# Patient Record
Sex: Male | Born: 1960 | Race: Black or African American | Hispanic: No | Marital: Married | Smoking: Never smoker
Health system: Southern US, Community
[De-identification: ages and names within clinical notes are randomized; demographics above are authoritative.]

## PROBLEM LIST (undated history)

## (undated) DIAGNOSIS — E785 Hyperlipidemia, unspecified: Secondary | ICD-10-CM

## (undated) DIAGNOSIS — I10 Essential (primary) hypertension: Secondary | ICD-10-CM

## (undated) HISTORY — DX: Essential (primary) hypertension: I10

## (undated) HISTORY — DX: Hyperlipidemia, unspecified: E78.5

---

## 2003-08-04 ENCOUNTER — Emergency Department (HOSPITAL_COMMUNITY): Admission: EM | Admit: 2003-08-04 | Discharge: 2003-08-04 | Payer: Self-pay | Admitting: Emergency Medicine

## 2003-08-14 ENCOUNTER — Emergency Department (HOSPITAL_COMMUNITY): Admission: EM | Admit: 2003-08-14 | Discharge: 2003-08-14 | Payer: Self-pay | Admitting: *Deleted

## 2014-09-14 ENCOUNTER — Ambulatory Visit (INDEPENDENT_AMBULATORY_CARE_PROVIDER_SITE_OTHER): Payer: BC Managed Care – PPO | Admitting: Emergency Medicine

## 2014-09-14 VITALS — BP 136/105 | HR 61 | Temp 99.0°F | Resp 18 | Ht 69.0 in | Wt 198.0 lb

## 2014-09-14 DIAGNOSIS — Z0000 Encounter for general adult medical examination without abnormal findings: Secondary | ICD-10-CM

## 2014-09-14 DIAGNOSIS — Z1211 Encounter for screening for malignant neoplasm of colon: Secondary | ICD-10-CM

## 2014-09-14 DIAGNOSIS — I10 Essential (primary) hypertension: Secondary | ICD-10-CM

## 2014-09-14 LAB — POCT CBC
GRANULOCYTE PERCENT: 72.4 % (ref 37–80)
HCT, POC: 48.4 % (ref 43.5–53.7)
Hemoglobin: 16 g/dL (ref 14.1–18.1)
Lymph, poc: 1.5 (ref 0.6–3.4)
MCH: 29.3 pg (ref 27–31.2)
MCHC: 33.2 g/dL (ref 31.8–35.4)
MCV: 88.4 fL (ref 80–97)
MID (CBC): 0.3 (ref 0–0.9)
MPV: 6.9 fL (ref 0–99.8)
PLATELET COUNT, POC: 272 10*3/uL (ref 142–424)
POC GRANULOCYTE: 4.7 (ref 2–6.9)
POC LYMPH PERCENT: 22.7 %L (ref 10–50)
POC MID %: 4.9 % (ref 0–12)
RBC: 5.47 M/uL (ref 4.69–6.13)
RDW, POC: 13.4 %
WBC: 6.5 10*3/uL (ref 4.6–10.2)

## 2014-09-14 LAB — POCT URINALYSIS DIPSTICK
Bilirubin, UA: NEGATIVE
Glucose, UA: NEGATIVE
Ketones, UA: NEGATIVE
Leukocytes, UA: NEGATIVE
NITRITE UA: NEGATIVE
PH UA: 6
Protein, UA: NEGATIVE
RBC UA: NEGATIVE
Spec Grav, UA: 1.015
Urobilinogen, UA: 0.2

## 2014-09-14 LAB — COMPREHENSIVE METABOLIC PANEL
ALK PHOS: 33 U/L — AB (ref 39–117)
ALT: 26 U/L (ref 0–53)
AST: 23 U/L (ref 0–37)
Albumin: 4.6 g/dL (ref 3.5–5.2)
BUN: 13 mg/dL (ref 6–23)
CO2: 26 mEq/L (ref 19–32)
Calcium: 9.6 mg/dL (ref 8.4–10.5)
Chloride: 103 mEq/L (ref 96–112)
Creat: 1.1 mg/dL (ref 0.50–1.35)
Glucose, Bld: 100 mg/dL — ABNORMAL HIGH (ref 70–99)
POTASSIUM: 4.5 meq/L (ref 3.5–5.3)
Sodium: 139 mEq/L (ref 135–145)
Total Bilirubin: 0.6 mg/dL (ref 0.2–1.2)
Total Protein: 7.3 g/dL (ref 6.0–8.3)

## 2014-09-14 LAB — LIPID PANEL
CHOL/HDL RATIO: 4.9 ratio
Cholesterol: 225 mg/dL — ABNORMAL HIGH (ref 0–200)
HDL: 46 mg/dL (ref >39)
LDL Cholesterol: 139 mg/dL — ABNORMAL HIGH (ref 0–99)
Triglycerides: 199 mg/dL — ABNORMAL HIGH (ref <150)
VLDL: 40 mg/dL (ref 0–40)

## 2014-09-14 LAB — TSH: TSH: 0.82 u[IU]/mL (ref 0.350–4.500)

## 2014-09-14 MED ORDER — LISINOPRIL 20 MG PO TABS: 20.0000 mg | ORAL_TABLET | Freq: Every day | ORAL | Status: DC

## 2014-09-14 NOTE — Patient Instructions (Signed)

## 2014-09-14 NOTE — Progress Notes (Signed)
[facility] [address], [place] [address] [phone]  Date:  09/14/2014   Name:  Troy Anderson   DOB:  12/17/1960   MRN:  [id number]  PCP:  No primary care provider on file.    Chief Complaint: Annual Exam   History of Present Illness:  Troy Anderson is a 53 y.o. very pleasant male patient who presents with the following:  Wellness examination.  Says has had hypertension for a long time Denies other complaint or health concern today.   There are no active problems to display for this patient.   No past medical history on file.  No past surgical history on file.  History  Substance Use Topics  . Smoking status: Never Smoker   . Smokeless tobacco: Not on file  . Alcohol Use: 0.0 oz/week    0 Not specified per week    Family History  Problem Relation Age of Onset  . Cancer Mother   . Diabetes Father     Not on File  Medication list has been reviewed and updated.  No current outpatient prescriptions on file prior to visit.   No current facility-administered medications on file prior to visit.    Review of Systems:  As per HPI, otherwise negative.    Physical Examination: Filed Vitals:   09/14/14 0840  BP: 136/105  Pulse: 61  Temp: 99 F (37.2 C)  Resp: 18   Filed Vitals:   09/14/14 0840  Height: 5\' 9"  (1.753 m)  Weight: 198 lb (89.812 kg)   Body mass index is 29.23 kg/(m^2). Ideal Body Weight: Weight in (lb) to have BMI = 25: 168.9  GEN: WDWN, NAD, Non-toxic, A & O x 3 HEENT: Atraumatic, Normocephalic. Neck supple. No masses, No LAD. Ears and Nose: No external deformity. CV: RRR, No M/G/R. No JVD. No thrill. No extra heart sounds. PULM: CTA B, no wheezes, crackles, rhonchi. No retractions. No resp. distress. No accessory muscle use. ABD: S, NT, ND, +BS. No rebound. No HSM. EXTR: No c/c/e NEURO Normal gait.  PSYCH: Normally interactive. Conversant. Not depressed or anxious appearing.  Calm demeanor.     Assessment and Plan: Wellness exam Colonoscopy Labs Hypertension Lisinopril Follow up 3 months  Signed,  Ellison Carwin, MD

## 2014-09-15 MED ORDER — ATORVASTATIN CALCIUM 20 MG PO TABS: 20.0000 mg | ORAL_TABLET | Freq: Every day | ORAL | Status: DC

## 2014-09-15 NOTE — Addendum Note (Signed)
Addended by: Roselee Culver on: 09/15/2014 08:55 AM   Modules accepted: Orders

## 2014-09-16 ENCOUNTER — Encounter: Payer: Self-pay | Admitting: Gastroenterology

## 2014-09-16 ENCOUNTER — Telehealth: Payer: Self-pay

## 2014-09-16 LAB — PSA: PSA: 0.7 ng/mL (ref <=4.00)

## 2014-09-16 MED ORDER — ATORVASTATIN CALCIUM 20 MG PO TABS: 20.0000 mg | ORAL_TABLET | Freq: Every day | ORAL | Status: DC

## 2014-09-16 NOTE — Telephone Encounter (Signed)
Pt called and stated he already got the lisinopril filled but would like lipitor sent to [facility]. Sent in Rx.

## 2014-09-16 NOTE — Telephone Encounter (Signed)
LMOM for pt to [facility] w/pharmacy info. Rxs printed instead of being sent at [facility] bc no pharm in system.

## 2014-11-11 ENCOUNTER — Ambulatory Visit (AMBULATORY_SURGERY_CENTER): Payer: Self-pay | Admitting: *Deleted

## 2014-11-11 VITALS — Ht 69.5 in | Wt 197.8 lb

## 2014-11-11 DIAGNOSIS — Z1211 Encounter for screening for malignant neoplasm of colon: Secondary | ICD-10-CM

## 2014-11-11 MED ORDER — MOVIPREP 100 G PO SOLR: 1.0000 | Freq: Once | ORAL | Status: DC

## 2014-11-11 NOTE — Progress Notes (Signed)
Denies allergies to eggs or soy products. Denies complications with sedation or anesthesia. Denies O2 use. Denies use of diet or weight loss medications.  Emmi instructions given for colonoscopy.  

## 2014-11-26 ENCOUNTER — Encounter: Payer: Self-pay | Admitting: Gastroenterology

## 2014-11-28 ENCOUNTER — Telehealth: Payer: Self-pay | Admitting: Gastroenterology

## 2014-11-28 NOTE — Telephone Encounter (Signed)
Called patient and gave him the website and the code to watch the colonoscopy video.  He stated that he "didn't get it the first time."  He is on a clear liquid diet today, and seems to be following directions for tomorrow's procedure.

## 2014-11-29 ENCOUNTER — Ambulatory Visit (AMBULATORY_SURGERY_CENTER): Payer: BLUE CROSS/BLUE SHIELD | Admitting: Gastroenterology

## 2014-11-29 ENCOUNTER — Encounter: Payer: Self-pay | Admitting: Gastroenterology

## 2014-11-29 VITALS — BP 134/87 | HR 66 | Temp 96.8°F | Resp 26 | Ht 69.5 in | Wt 197.0 lb

## 2014-11-29 DIAGNOSIS — Z1211 Encounter for screening for malignant neoplasm of colon: Secondary | ICD-10-CM

## 2014-11-29 DIAGNOSIS — D120 Benign neoplasm of cecum: Secondary | ICD-10-CM

## 2014-11-29 DIAGNOSIS — D122 Benign neoplasm of ascending colon: Secondary | ICD-10-CM | POA: Diagnosis not present

## 2014-11-29 DIAGNOSIS — D124 Benign neoplasm of descending colon: Secondary | ICD-10-CM | POA: Diagnosis not present

## 2014-11-29 MED ORDER — SODIUM CHLORIDE 0.9 % IV SOLN: 500.0000 mL | INTRAVENOUS | Status: DC

## 2014-11-29 NOTE — Patient Instructions (Signed)
YOU HAD AN ENDOSCOPIC PROCEDURE TODAY AT THE  ENDOSCOPY CENTER:   Refer to the procedure report that was given to you for any specific questions about what was found during the examination.  If the procedure report does not answer your questions, please call your gastroenterologist to clarify.  If you requested that your care partner not be given the details of your procedure findings, then the procedure report has been included in a sealed envelope for you to review at your convenience later.  YOU SHOULD EXPECT: Some feelings of bloating in the abdomen. Passage of more gas than usual.  Walking can help get rid of the air that was put into your GI tract during the procedure and reduce the bloating. If you had a lower endoscopy (such as a colonoscopy or flexible sigmoidoscopy) you may notice spotting of blood in your stool or on the toilet paper. If you underwent a bowel prep for your procedure, you may not have a normal bowel movement for a few days.  Please Note:  You might notice some irritation and congestion in your nose or some drainage.  This is from the oxygen used during your procedure.  There is no need for concern and it should clear up in a day or so.  SYMPTOMS TO REPORT IMMEDIATELY:   Following lower endoscopy (colonoscopy or flexible sigmoidoscopy):  Excessive amounts of blood in the stool  Significant tenderness or worsening of abdominal pains  Swelling of the abdomen that is new, acute  Fever of 100F or higher    For urgent or emergent issues, a gastroenterologist can be reached at any hour by calling [phone].   DIET: Your first meal following the procedure should be a small meal and then it is ok to progress to your normal diet. Heavy or fried foods are harder to digest and may make you feel nauseous or bloated.  Likewise, meals heavy in dairy and vegetables can increase bloating.  Drink plenty of fluids but you should avoid alcoholic beverages for 24  hours.  ACTIVITY:  You should plan to take it easy for the rest of today and you should NOT DRIVE or use heavy machinery until tomorrow (because of the sedation medicines used during the test).    FOLLOW UP: Our staff will call the number listed on your records the next business day following your procedure to check on you and address any questions or concerns that you may have regarding the information given to you following your procedure. If we do not reach you, we will leave a message.  However, if you are feeling well and you are not experiencing any problems, there is no need to return our call.  We will assume that you have returned to your regular daily activities without incident.  If any biopsies were taken you will be contacted by phone or by letter within the next 1-3 weeks.  Please call us at [phone] if you have not heard about the biopsies in 3 weeks.    SIGNATURES/CONFIDENTIALITY: You and/or your care partner have signed paperwork which will be entered into your electronic medical record.  These signatures attest to the fact that that the information above on your After Visit Summary has been reviewed and is understood.  Full responsibility of the confidentiality of this discharge information lies with you and/or your care-partner.   Information on polyps given to you today 

## 2014-11-29 NOTE — Progress Notes (Signed)
Stable to RR 

## 2014-11-29 NOTE — Op Note (Signed)
[place]  [address] [place], [address]   COLONOSCOPY PROCEDURE REPORT  PATIENT: Troy Anderson, Troy Anderson  MR#: [id number] BIRTHDATE: [birth year] , 53  yrs. old GENDER: male ENDOSCOPIST: Milus Banister, MD PROCEDURE DATE:  11/29/2014 PROCEDURE:   Colonoscopy with snare polypectomy First Screening Colonoscopy - Avg.  risk and is 50 yrs.  old or older Yes.  Prior Negative Screening - Now for repeat screening. N/A  History of Adenoma - Now for follow-up colonoscopy & has been > or = to 3 yrs.  N/A  Polyps Removed Today? Yes. ASA CLASS:   Class II INDICATIONS:patient's immediate family history of colon cancer. MEDICATIONS: Monitored anesthesia care, Propofol 400 mg IV, and Lidocaine 40 mg IV  DESCRIPTION OF PROCEDURE:   After the risks benefits and alternatives of the procedure were thoroughly explained, informed consent was obtained.  The digital rectal exam revealed no abnormalities of the rectum.   The LB [id number]  endoscope was introduced through the anus and advanced to the cecum, which was identified by both the appendix and ileocecal valve. No adverse events experienced.   The quality of the prep was excellent.  The instrument was then slowly withdrawn as the colon was fully examined.   COLON FINDINGS: Three polyps were found, removed and sent to pathology.  These were all sessile, 2-28mm across, located in cecum, ascending, sigmoid segments.  All were removed with cold snare. The examination was otherwise normal.  Retroflexed views revealed no abnormalities. The time to cecum=1 minutes 46 seconds. Withdrawal time=11 minutes 16 seconds.  The scope was withdrawn and the procedure completed. COMPLICATIONS: There were no immediate complications.  ENDOSCOPIC IMPRESSION: Three polyps were found, removed and sent to pathology. The examination was otherwise normal  RECOMMENDATIONS: If the polyp(s) removed today are proven to be adenomatous (pre-cancerous)  polyps, you will need a colonoscopy in 3-5  years. Otherwise you should continue to follow colorectal cancer screening guidelines for "routine risk" patients with a colonoscopy in 10 years.  You will receive a letter within 1-2 weeks with the results of your biopsy as well as final recommendations.  Please call my office if you have not received a letter after 3 weeks.  eSigned:  Milus Banister, MD 11/29/2014 8:38 AM

## 2014-11-29 NOTE — Progress Notes (Signed)
Called to room to assist during endoscopic procedure.  Patient ID and intended procedure confirmed with present staff. Received instructions for my participation in the procedure from the performing physician.  

## 2014-12-02 ENCOUNTER — Telehealth: Payer: Self-pay | Admitting: *Deleted

## 2014-12-02 NOTE — Telephone Encounter (Signed)
  Follow up Call-  Call back number 11/29/2014  Post procedure Call Back phone  # [phone]  Permission to leave phone message Yes     Patient questions:  Left message to call us if necessary.

## 2014-12-09 ENCOUNTER — Encounter: Payer: Self-pay | Admitting: Gastroenterology

## 2014-12-13 ENCOUNTER — Telehealth: Payer: Self-pay

## 2014-12-13 NOTE — Telephone Encounter (Signed)
Refill on cholesterol medication. Vladimir Faster on [address]    [phone]

## 2014-12-14 ENCOUNTER — Other Ambulatory Visit: Payer: Self-pay | Admitting: Emergency Medicine

## 2014-12-16 ENCOUNTER — Other Ambulatory Visit: Payer: Self-pay | Admitting: Emergency Medicine

## 2014-12-16 NOTE — Telephone Encounter (Signed)
Spoke to pt, he is aware of refill and need for ov for further refills

## 2015-08-08 ENCOUNTER — Ambulatory Visit
Admission: EM | Admit: 2015-08-08 | Discharge: 2015-08-08 | Disposition: A | Discharge status: Home / Self Care | Payer: PRIVATE HEALTH INSURANCE | Attending: Family Medicine | Admitting: Family Medicine

## 2015-08-08 ENCOUNTER — Encounter: Payer: Self-pay | Admitting: Emergency Medicine

## 2015-08-08 DIAGNOSIS — Z029 Encounter for administrative examinations, unspecified: Secondary | ICD-10-CM

## 2015-08-08 DIAGNOSIS — Z024 Encounter for examination for driving license: Secondary | ICD-10-CM

## 2015-08-08 LAB — DEPT OF TRANSP DIPSTICK, URINE (ARMC ONLY)
Glucose, UA: NEGATIVE mg/dL
Hgb urine dipstick: NEGATIVE
PROTEIN: NEGATIVE mg/dL
SPECIFIC GRAVITY, URINE: 1.015 (ref 1.005–1.030)

## 2015-08-08 NOTE — ED Provider Notes (Signed)
CSN: :[id number]     Arrival date & time 08/08/15  1305 History   First MD Initiated Contact with Patient 08/08/15 1400     Chief Complaint  Patient presents with  . 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  Last saw PCM 03 Mar 2015 BP 135/68.  Has colonoscopy 11/28/2014 3 polyps removed.  Optometry last month glasses working well for him.  Cholesterol diet controlled statin gave him muscle aches.  Denied hospitalizations, ER visits, LOC, chest pain, headaches, seasonal allergies, back pain, injuries or tatoos.  The history is provided by the patient.    Past Medical History  Diagnosis Date  . Hyperlipidemia   . Hypertension    History reviewed. No pertinent past surgical history. Family History  Problem Relation Age of Onset  . Cancer Mother   . Colon cancer Mother   . Stomach cancer Mother   . Diabetes Father   . Esophageal cancer Neg Hx   . Rectal cancer Neg Hx    Social History  Substance Use Topics  . Smoking status: Never Smoker   . Smokeless tobacco: Never Used  . Alcohol Use: 0.6 oz/week    1 Standard drinks or equivalent per week    Review of Systems  Constitutional: Negative for fever and chills.  HENT: Negative for congestion, ear pain and sore throat.   Eyes: Negative for pain and discharge.  Respiratory: Negative for cough and wheezing.   Cardiovascular: Negative for chest pain and leg swelling.  Gastrointestinal: Negative for nausea, vomiting, diarrhea, constipation and blood in stool.  Genitourinary: Negative for dysuria, hematuria and difficulty urinating.  Musculoskeletal: Negative for myalgias, back pain and arthralgias.  Skin: Negative for rash.  Allergic/Immunologic: Negative for environmental  allergies and food allergies.  Neurological: Negative for headaches.  Hematological: Negative for adenopathy. Does not bruise/bleed easily.  Psychiatric/Behavioral: Negative for confusion and agitation. The patient is not nervous/anxious.     Allergies  Review of patient's allergies indicates no known allergies.  Home Medications   Prior to Admission medications   Medication Sig Start Date End Date Taking? Authorizing Provider  atorvastatin (LIPITOR) 20 MG tablet TAKE ONE TABLET BY MOUTH ONCE DAILY.  "OV NEEDED FOR ADDITIONAL REFILLS" 12/15/14   Mancel Bale, PA-C  atorvastatin (LIPITOR) 20 MG tablet Take one tablet by mouth once daily.  "OV NEEDED FOR ADDITIONAL REFILLS" 12/16/14   Harrison Mons, PA-C  lisinopril (PRINIVIL,ZESTRIL) 20 MG tablet Take 1 tablet (20 mg total) by mouth daily. 09/14/14   Roselee Culver, MD   Meds Ordered and Administered this Visit  Medications - No data to display  BP 145/90 mmHg  Pulse 90  Temp(Src) 98.4 F (36.9 C) (Tympanic)  Resp 16  Ht 5' 9.5" (1.765 m)  Wt 199 lb (90.266 kg)  BMI 28.98 kg/m2  SpO2 100% No data found.   Physical Exam  Constitutional: He is oriented to person, place, and time. Vital signs are normal. He appears well-developed and well-nourished. He is active and cooperative.  Non-toxic appearance. He does not have a sickly appearance. He does not appear ill. No distress.  HENT:  Head: Normocephalic and atraumatic.  Right Ear: Hearing, external ear and ear canal normal. A middle ear effusion is present.  Left Ear: Hearing, external ear and ear  canal normal. A middle ear effusion is present.  Nose: Mucosal edema and rhinorrhea present. No nose lacerations, sinus tenderness, nasal deformity, septal deviation or nasal septal hematoma. No epistaxis.  No foreign bodies. Right sinus exhibits maxillary sinus tenderness. Right sinus exhibits no frontal sinus tenderness. Left sinus exhibits maxillary sinus tenderness. Left sinus  exhibits no frontal sinus tenderness.  Mouth/Throat: Uvula is midline and mucous membranes are normal. Mucous membranes are not pale, not dry and not cyanotic. He does not have dentures. No oral lesions. No trismus in the jaw. Normal dentition. No dental abscesses, uvula swelling, lacerations or dental caries. Posterior oropharyngeal edema and posterior oropharyngeal erythema present. No oropharyngeal exudate or tonsillar abscesses.  Eyes: Conjunctivae, EOM and lids are normal. Pupils are equal, round, and reactive to light. Right eye exhibits no chemosis, no discharge, no exudate and no hordeolum. No foreign body present in the right eye. Left eye exhibits no chemosis, no discharge, no exudate and no hordeolum. No foreign body present in the left eye. Right conjunctiva is not injected. Right conjunctiva has no hemorrhage. Left conjunctiva is not injected. Left conjunctiva has no hemorrhage. No scleral icterus. Right eye exhibits normal extraocular motion and no nystagmus. Left eye exhibits normal extraocular motion and no nystagmus. Right pupil is round and reactive. Left pupil is round and reactive. Pupils are equal.  Neck: Trachea normal and normal range of motion. Neck supple. No tracheal tenderness, no spinous process tenderness and no muscular tenderness present. No rigidity. No tracheal deviation, no edema, no erythema and normal range of motion present. No thyroid mass and no thyromegaly present.  Cardiovascular: Normal rate, regular rhythm, S1 normal, S2 normal, normal heart sounds and intact distal pulses.  PMI is not displaced.  Exam reveals no gallop and no friction rub.   No murmur heard. Pulses:      Radial pulses are 2+ on the right side, and 2+ on the left side.  Pulmonary/Chest: Effort normal and breath sounds normal. No stridor. No respiratory distress. He has no decreased breath sounds. He has no wheezes. He has no rhonchi. He has no rales. He exhibits no tenderness.  Abdominal: Soft.  Bowel sounds are normal. He exhibits no distension and no mass. There is no hepatosplenomegaly. There is no tenderness. There is no rebound and no guarding. Hernia confirmed negative in the ventral area, confirmed negative in the right inguinal area and confirmed negative in the left inguinal area.  Dull to percussion x 4 quads  Musculoskeletal: Normal range of motion. He exhibits no edema or tenderness.       Right shoulder: Normal.       Left shoulder: Normal.       Right elbow: Normal.      Left elbow: Normal.       Right wrist: Normal.       Left wrist: Normal.       Right hip: Normal.       Left hip: Normal.       Right knee: Normal.       Left knee: Normal.       Right ankle: Normal.       Left ankle: Normal.       Cervical back: Normal.       Thoracic back: Normal.       Lumbar back: Normal.       Right hand: Normal.       Left hand: Normal.       Left foot: Normal.  Lymphadenopathy:       Head (right side): No submental, no submandibular, no tonsillar, no preauricular, no posterior auricular and no occipital adenopathy present.       Head (left side): No submental, no submandibular, no tonsillar, no preauricular, no posterior auricular and no occipital adenopathy present.    He has no cervical adenopathy.       Right cervical: No superficial cervical, no deep cervical and no posterior cervical adenopathy present.      Left cervical: No superficial cervical, no deep cervical and no posterior cervical adenopathy present.  Neurological: He is alert and oriented to person, place, and time. He is not disoriented. He displays no atrophy and no tremor. No cranial nerve deficit or sensory deficit. He exhibits normal muscle tone. He displays no seizure activity. Coordination and gait normal. GCS eye subscore is 4. GCS verbal subscore is 5. GCS motor subscore is 6.  Reflex Scores:      Patellar reflexes are 2+ on the right side and 2+ on the left side.      Achilles reflexes are 2+ on the  right side and 2+ on the left side. Skin: Skin is warm, dry and intact. No abrasion, no bruising, no burn, no ecchymosis, no laceration, no lesion, no petechiae and no rash noted. He is not diaphoretic. No cyanosis or erythema. No pallor. Nails show no clubbing.     Psychiatric: He has a normal mood and affect. His speech is normal and behavior is normal. Judgment and thought content normal. Cognition and memory are normal.  Nursing note and vitals reviewed.   ED Course  Procedures (including critical care time)  Labs Review Labs Reviewed  DEPT OF TRANSP DIPSTICK, URINE([facility] ONLY)    Imaging Review No results found.   Visual Acuity Review  Right Eye Distance: 20/20 corrected Left Eye Distance: 20/20 corrected Bilateral Distance: 20/15 corrected  Right Eye Near:   Left Eye Near:    Bilateral Near:      1500 given copy of urinalysis results, going to go eat lunch, hydrate and return for repeat blood pressure. Reviewed care everywhere EPIC and patient blood pressure 130s/70s every recorded visit.  Patient has pictures on phone when he checks himself and all below 140/90.  Typically takes blood pressure medication at bedtime.   Patient verbalized understanding of information/instructions, agreed with plan of care and had no further questions at this time.  1600 patient returned for repeat blood pressure.  Discussed with nurse to have him sit in chair for 10-15 minutes before repeating measurement.  Patient verbalized understanding of information/instructions and had no further questions at this time  1645 patient reported he had to urinate, anxious because his blood pressure readings have not been high at other doctor visits and this is pre-employment physical.  Had patient urinate, sit in chair and manual blood pressure performed 140/90 pulse 90.  Completed paperwork.  Patient is going to follow up with his PCM.  Discussed with patient anxiety, stress, caffeine can all  raise his blood pressure.  Patient verbalized understanding of information/instructions, agreed with plan of care and had no further questions at this time.  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 BMI 29 discussed weight loss to BMI less than 25 or 171lbs and activity 150 minutes per week recommended.  Recommended routine optometry exams every 1-2 years due  to hypertension and age. See copies in record manager for [place], [id number].  Patient instructed to follow up for re-evaluation in 1 year sooner if changes in his health status e.g. diagnosis chronic disease, surgery, hospitalization.  Discussed with patient urinalysis was normal.  Exitcare handout on obesity, dash diet, hypertension and medical screening given to patient.   Patient verbalized understanding of information/instructions, agreed with plan of care and had no further questions at this time.   Olen Cordial, NP 08/09/15 0800

## 2015-08-08 NOTE — ED Notes (Signed)
Patient here for DOT Physical.  

## 2015-08-08 NOTE — Discharge Instructions (Signed)
Medical Screening Exam °A medical screening exam has been done. This exam helps find the cause of your problem and determines whether you need emergency treatment. Your exam has shown that you do not need emergency treatment at this point. It is safe for you to go to your caregiver's office or clinic for treatment. You should make an appointment today to see your caregiver as soon as he or she is available. °Depending on your illness, your symptoms and condition can change over time. If your condition gets worse or you develop new or troubling symptoms before you see your caregiver, you should return to the emergency department for further evaluation.  °  °This information is not intended to replace advice given to you by your health care provider. Make sure you discuss any questions you have with your health care provider. °  °Document Released: 10/21/2004 Document Revised: 10/04/2014 Document Reviewed: 06/02/2011 °Elsevier Interactive Patient Education ©2016 [employer] °Hypertension °Hypertension, commonly called high blood pressure, is when the force of blood pumping through your arteries is too strong. Your arteries are the blood vessels that carry blood from your heart throughout your body. A blood pressure reading consists of a higher number over a lower number, such as 110/72. The higher number (systolic) is the pressure inside your arteries when your heart pumps. The lower number (diastolic) is the pressure inside your arteries when your heart relaxes. Ideally you want your blood pressure below 120/80. °Hypertension forces your heart to work harder to pump blood. Your arteries may become narrow or stiff. Having untreated or uncontrolled hypertension can cause heart attack, stroke, kidney disease, and other problems. °RISK FACTORS °Some risk factors for high blood pressure are controllable. Others are not.  °Risk factors you cannot control include:  °· Race. You may be at higher risk if you are African  American. °· Age. Risk increases with age. °· Gender. Men are at higher risk than women before age 45 years. After age 65, women are at higher risk than men. °Risk factors you can control include: °· Not getting enough exercise or physical activity. °· Being overweight. °· Getting too much fat, sugar, calories, or salt in your diet. °· Drinking too much alcohol. °SIGNS AND SYMPTOMS °Hypertension does not usually cause signs or symptoms. Extremely high blood pressure (hypertensive crisis) may cause headache, anxiety, shortness of breath, and nosebleed. °DIAGNOSIS °To check if you have hypertension, your health care provider will measure your blood pressure while you are seated, with your arm held at the level of your heart. It should be measured at least twice using the same arm. Certain conditions can cause a difference in blood pressure between your right and left arms. A blood pressure reading that is higher than normal on one occasion does not mean that you need treatment. If it is not clear whether you have high blood pressure, you may be asked to return on a different day to have your blood pressure checked again. Or, you may be asked to monitor your blood pressure at home for 1 or more weeks. °TREATMENT °Treating high blood pressure includes making lifestyle changes and possibly taking medicine. Living a healthy lifestyle can help lower high blood pressure. You may need to change some of your habits. °Lifestyle changes may include: °· Following the DASH diet. This diet is high in fruits, vegetables, and whole grains. It is low in salt, red meat, and added sugars. °· Keep your sodium intake below 2,300 mg per day. °· Getting at   least 30-45 minutes of aerobic exercise at least 4 times per week. °· Losing weight if necessary. °· Not smoking. °· Limiting alcoholic beverages. °· Learning ways to reduce stress. °Your health care provider may prescribe medicine if lifestyle changes are not enough to get your blood  pressure under control, and if one of the following is true: °· You are 18-59 years of age and your systolic blood pressure is above 140. °· You are 60 years of age or older, and your systolic blood pressure is above 150. °· Your diastolic blood pressure is above 90. °· You have diabetes, and your systolic blood pressure is over 140 or your diastolic blood pressure is over 90. °· You have kidney disease and your blood pressure is above 140/90. °· You have heart disease and your blood pressure is above 140/90. °Your personal target blood pressure may vary depending on your medical conditions, your age, and other factors. °HOME CARE INSTRUCTIONS °· Have your blood pressure rechecked as directed by your health care provider.   °· Take medicines only as directed by your health care provider. Follow the directions carefully. Blood pressure medicines must be taken as prescribed. The medicine does not work as well when you skip doses. Skipping doses also puts you at risk for problems. °· Do not smoke.   °· Monitor your blood pressure at home as directed by your health care provider.  °SEEK MEDICAL CARE IF:  °· You think you are having a reaction to medicines taken. °· You have recurrent headaches or feel dizzy. °· You have swelling in your ankles. °· You have trouble with your vision. °SEEK IMMEDIATE MEDICAL CARE IF: °· You develop a severe headache or confusion. °· You have unusual weakness, numbness, or feel faint. °· You have severe chest or abdominal pain. °· You vomit repeatedly. °· You have trouble breathing. °MAKE SURE YOU:  °· Understand these instructions. °· Will watch your condition. °· Will get help right away if you are not doing well or get worse. °  °This information is not intended to replace advice given to you by your health care provider. Make sure you discuss any questions you have with your health care provider. °  °Document Released: 09/13/2005 Document Revised: 01/28/2015 Document Reviewed:  07/06/2013 °Elsevier Interactive Patient Education ©2016 [employer] Inc[employer] °DASH Eating Plan °DASH stands for "Dietary Approaches to Stop Hypertension." The DASH eating plan is a healthy eating plan that has been shown to reduce high blood pressure (hypertension). Additional health benefits may include reducing the risk of type 2 diabetes mellitus, heart disease, and stroke. The DASH eating plan may also help with weight loss. °WHAT DO I NEED TO KNOW ABOUT THE DASH EATING PLAN? °For the DASH eating plan, you will follow these general guidelines: °· Choose foods with a percent daily value for sodium of less than 5% (as listed on the food label). °· Use salt-free seasonings or herbs instead of table salt or sea salt. °· Check with your health care provider or pharmacist before using salt substitutes. °· Eat lower-sodium products, often labeled as "lower sodium" or "no salt added." °· Eat fresh foods. °· Eat more vegetables, fruits, and low-fat dairy products. °· Choose whole grains. Look for the word "whole" as the first word in the ingredient list. °· Choose fish and skinless chicken or turkey more often than red meat. Limit fish, poultry, and meat to 6 oz (170 g) each day. °· Limit sweets, desserts, sugars, and sugary drinks. °· Choose heart-healthy fats. °·   Limit cheese to 1 oz (28 g) per day. °· Eat more home-cooked food and less restaurant, buffet, and fast food. °· Limit fried foods. °· Cook foods using methods other than frying. °· Limit canned vegetables. If you do use them, rinse them well to decrease the sodium. °· When eating at a restaurant, ask that your food be prepared with less salt, or no salt if possible. °WHAT FOODS CAN I EAT? °Seek help from a dietitian for individual calorie needs. °Grains °Whole grain or whole wheat bread. Brown rice. Whole grain or whole wheat pasta. Quinoa, bulgur, and whole grain cereals. Low-sodium cereals. Corn or whole wheat flour tortillas. Whole grain cornbread. Whole grain  crackers. Low-sodium crackers. °Vegetables °Fresh or frozen vegetables (raw, steamed, roasted, or grilled). Low-sodium or reduced-sodium tomato and vegetable juices. Low-sodium or reduced-sodium tomato sauce and paste. Low-sodium or reduced-sodium canned vegetables.  °Fruits °All fresh, canned (in natural juice), or frozen fruits. °Meat and Other Protein Products °Ground beef (85% or leaner), grass-fed beef, or beef trimmed of fat. Skinless chicken or turkey. Ground chicken or turkey. Pork trimmed of fat. All fish and seafood. Eggs. Dried beans, peas, or lentils. Unsalted nuts and seeds. Unsalted canned beans. °Dairy °Low-fat dairy products, such as skim or 1% milk, 2% or reduced-fat cheeses, low-fat ricotta or cottage cheese, or plain low-fat yogurt. Low-sodium or reduced-sodium cheeses. °Fats and Oils °Tub margarines without trans fats. Light or reduced-fat mayonnaise and salad dressings (reduced sodium). Avocado. Safflower, olive, or canola oils. Natural peanut or almond butter. °Other °Unsalted popcorn and pretzels. °The items listed above may not be a complete list of recommended foods or beverages. Contact your dietitian for more options. °WHAT FOODS ARE NOT RECOMMENDED? °Grains °White bread. White pasta. White rice. Refined cornbread. Bagels and croissants. Crackers that contain trans fat. °Vegetables °Creamed or fried vegetables. Vegetables in a cheese sauce. Regular canned vegetables. Regular canned tomato sauce and paste. Regular tomato and vegetable juices. °Fruits °Dried fruits. Canned fruit in light or heavy syrup. Fruit juice. °Meat and Other Protein Products °Fatty cuts of meat. Ribs, chicken wings, bacon, sausage, bologna, salami, chitterlings, fatback, hot dogs, bratwurst, and packaged luncheon meats. Salted nuts and seeds. Canned beans with salt. °Dairy °Whole or 2% milk, cream, half-and-half, and cream cheese. Whole-fat or sweetened yogurt. Full-fat cheeses or blue cheese. Nondairy creamers and  whipped toppings. Processed cheese, cheese spreads, or cheese curds. °Condiments °Onion and garlic salt, seasoned salt, table salt, and sea salt. Canned and packaged gravies. Worcestershire sauce. Tartar sauce. Barbecue sauce. Teriyaki sauce. Soy sauce, including reduced sodium. Steak sauce. Fish sauce. Oyster sauce. Cocktail sauce. Horseradish. Ketchup and mustard. Meat flavorings and tenderizers. Bouillon cubes. Hot sauce. Tabasco sauce. Marinades. Taco seasonings. Relishes. °Fats and Oils °Butter, stick margarine, lard, shortening, ghee, and bacon fat. Coconut, palm kernel, or palm oils. Regular salad dressings. °Other °Pickles and olives. Salted popcorn and pretzels. °The items listed above may not be a complete list of foods and beverages to avoid. Contact your dietitian for more information. °WHERE CAN I FIND MORE INFORMATION? °[employer], [employer]: www.nhlbi.nih.gov/health/health-topics/topics/dash/ °  °This information is not intended to replace advice given to you by your health care provider. Make sure you discuss any questions you have with your health care provider. °  °Document Released: 09/02/2011 Document Revised: 10/04/2014 Document Reviewed: 07/18/2013 °Elsevier Interactive Patient Education ©2016 [employer] Inc. °Obesity °Obesity is defined as having too much total body fat and a body mass index (BMI) of 30 or more.   BMI is an estimate of body fat and is calculated from your height and weight. BMI is typically calculated by your health care provider during regular wellness visits. Obesity happens when you consume more calories than you can burn by exercising or performing daily physical tasks. Prolonged obesity can cause major illnesses or emergencies, such as: °· Stroke. °· Heart disease. °· Diabetes. °· Cancer. °· Arthritis. °· High blood pressure (hypertension). °· High cholesterol. °· Sleep apnea. °· Erectile dysfunction. °· Infertility problems. °CAUSES  °· Regularly eating  unhealthy foods. °· Physical inactivity. °· Certain disorders, such as an underactive thyroid (hypothyroidism), Cushing's syndrome, and polycystic ovarian syndrome. °· Certain medicines, such as steroids, some depression medicines, and antipsychotics. °· Genetics. °· Lack of sleep. °DIAGNOSIS °A health care provider can diagnose obesity after calculating your BMI. Obesity will be diagnosed if your BMI is 30 or higher. °There are other methods of measuring obesity levels. Some other methods include measuring your skinfold thickness, your waist circumference, and comparing your hip circumference to your waist circumference. °TREATMENT  °A healthy treatment program includes some or all of the following: °· Long-term dietary changes. °· Exercise and physical activity. °· Behavioral and lifestyle changes. °· Medicine only under the supervision of your health care provider. Medicines may help, but only if they are used with diet and exercise programs. °If your BMI is 40 or higher, your health care provider may recommend specialized surgery or programs to help with weight loss. °An unhealthy treatment program includes: °· Fasting. °· Fad diets. °· Supplements and drugs. °These choices do not succeed in long-term weight control. °HOME CARE INSTRUCTIONS °· Exercise and perform physical activity as directed by your health care provider. To increase physical activity, try the following: °· Use stairs instead of elevators. °· Park farther away from store entrances. °· Garden, bike, or walk instead of watching television or using the computer. °· Eat healthy, low-calorie foods and drinks on a regular basis. Eat more fruits and vegetables. Use low-calorie cookbooks or take healthy cooking classes. °· Limit fast food, sweets, and processed snack foods. °· Eat smaller portions. °· Keep a daily journal of everything you eat. There are many free websites to help you with this. It may be helpful to measure your foods so you can  determine if you are eating the correct portion sizes. °· Avoid drinking alcohol. Drink more water and drinks without calories. °· Take vitamins and supplements only as recommended by your health care provider. °· Weight-loss support groups, registered dietitians, counselors, and stress reduction education can also be very helpful. °SEEK IMMEDIATE MEDICAL CARE IF: °· You have chest pain or tightness. °· You have trouble breathing or feel short of breath. °· You have weakness or leg numbness. °· You feel confused or have trouble talking. °· You have sudden changes in your vision. °  °This information is not intended to replace advice given to you by your health care provider. Make sure you discuss any questions you have with your health care provider. °  °Document Released: 10/21/2004 Document Revised: 10/04/2014 Document Reviewed: 10/20/2011 °Elsevier Interactive Patient Education ©2016 Else[employer] Inc. ° °

## 2015-08-09 ENCOUNTER — Ambulatory Visit (INDEPENDENT_AMBULATORY_CARE_PROVIDER_SITE_OTHER): Payer: BLUE CROSS/BLUE SHIELD | Admitting: Urgent Care

## 2015-08-09 VITALS — BP 124/88 | HR 75 | Temp 98.7°F | Resp 16 | Ht 69.5 in | Wt 196.0 lb

## 2015-08-09 DIAGNOSIS — Z23 Encounter for immunization: Secondary | ICD-10-CM

## 2015-08-09 DIAGNOSIS — I10 Essential (primary) hypertension: Secondary | ICD-10-CM

## 2015-08-09 DIAGNOSIS — Z0000 Encounter for general adult medical examination without abnormal findings: Secondary | ICD-10-CM | POA: Diagnosis not present

## 2015-08-09 DIAGNOSIS — E785 Hyperlipidemia, unspecified: Secondary | ICD-10-CM

## 2015-08-09 LAB — COMPREHENSIVE METABOLIC PANEL
ALK PHOS: 33 U/L — AB (ref 40–115)
ALT: 23 U/L (ref 9–46)
AST: 23 U/L (ref 10–35)
Albumin: 4.6 g/dL (ref 3.6–5.1)
BILIRUBIN TOTAL: 0.7 mg/dL (ref 0.2–1.2)
BUN: 13 mg/dL (ref 7–25)
CO2: 26 mmol/L (ref 20–31)
Calcium: 9.4 mg/dL (ref 8.6–10.3)
Chloride: 103 mmol/L (ref 98–110)
Creat: 1.04 mg/dL (ref 0.70–1.33)
GLUCOSE: 105 mg/dL — AB (ref 65–99)
Potassium: 4.1 mmol/L (ref 3.5–5.3)
Sodium: 136 mmol/L (ref 135–146)
Total Protein: 7.4 g/dL (ref 6.1–8.1)

## 2015-08-09 LAB — CBC
HCT: 44.8 % (ref 39.0–52.0)
Hemoglobin: 15.4 g/dL (ref 13.0–17.0)
MCH: 29.4 pg (ref 26.0–34.0)
MCHC: 34.4 g/dL (ref 30.0–36.0)
MCV: 85.5 fL (ref 78.0–100.0)
MPV: 9.3 fL (ref 8.6–12.4)
PLATELETS: 279 10*3/uL (ref 150–400)
RBC: 5.24 MIL/uL (ref 4.22–5.81)
RDW: 13.3 % (ref 11.5–15.5)
WBC: 5.7 10*3/uL (ref 4.0–10.5)

## 2015-08-09 LAB — LIPID PANEL
CHOL/HDL RATIO: 4.8 ratio (ref <=5.0)
Cholesterol: 245 mg/dL — ABNORMAL HIGH (ref 125–200)
HDL: 51 mg/dL (ref >=40)
LDL CALC: 153 mg/dL — AB (ref <130)
Triglycerides: 207 mg/dL — ABNORMAL HIGH (ref <150)
VLDL: 41 mg/dL — ABNORMAL HIGH (ref <30)

## 2015-08-09 LAB — TSH: TSH: 0.753 u[IU]/mL (ref 0.350–4.500)

## 2015-08-09 LAB — MICROALBUMIN, URINE

## 2015-08-09 MED ORDER — LISINOPRIL 20 MG PO TABS: 20.0000 mg | ORAL_TABLET | Freq: Every day | ORAL | Status: DC

## 2015-08-09 MED ORDER — ATORVASTATIN CALCIUM 20 MG PO TABS: 20.0000 mg | ORAL_TABLET | Freq: Every day | ORAL | Status: DC

## 2015-08-09 NOTE — Progress Notes (Signed)
MRN: [id number]  Subjective:   [name] is a 54 y.o. male presenting for annual physical exam.  Medical care team includes: PCP: No primary care provider on file. Vision: Dr. Sharen Counter, last eye exam 07/23/2015. Dental: [place], gets cleanings every 6 months, last cleaning 07/14/2015. Specialists: Colonoscopy performed 2015, found 3 polyps, non-cancerous, recommended 5 year screening due to colon cancer in his mother.   Alroy does not have any active problems on his problem list.  Patient is a driver, married, works as a Geophysicist/field seismologist. Patient admits unhealthy diet and occasional exercise. He is a English as a second language teacher. States that he is not interested in flu shot.  Keita has a current medication list which includes the following prescription(s): lisinopril. He has No Known Allergies.  Troy Anderson  has a past medical history of Hyperlipidemia and Hypertension. Also  has no past surgical history on file.  His family history includes Cancer in his mother; Colon cancer in his mother; Diabetes in his father; Stomach cancer in his mother. There is no history of Esophageal cancer or Rectal cancer.  Immunizations: last TDAP unknown  Review of Systems  Constitutional: Negative for fever, chills, weight loss, malaise/fatigue and diaphoresis.  HENT: Negative for congestion, ear discharge, ear pain, hearing loss, nosebleeds, sore throat and tinnitus.   Eyes: Negative for blurred vision, double vision, photophobia, pain, discharge and redness.  Respiratory: Negative for cough, shortness of breath and wheezing.   Cardiovascular: Negative for chest pain, palpitations and leg swelling.  Gastrointestinal: Negative for nausea, vomiting, abdominal pain, diarrhea, constipation and blood in stool.  Genitourinary: Negative for dysuria, urgency, frequency, hematuria and flank pain.  Musculoskeletal: Negative for myalgias, back pain and joint pain.  Skin: Negative for itching and rash.  Neurological:  Negative for dizziness, tingling, seizures, loss of consciousness, weakness and headaches.  Endo/Heme/Allergies: Negative for polydipsia.  Psychiatric/Behavioral: Negative for depression, suicidal ideas, hallucinations, memory loss and substance abuse. The patient is not nervous/anxious and does not have insomnia.     Objective:   Vitals: BP 124/88 mmHg  Pulse 75  Temp(Src) 98.7 F (37.1 C) (Oral)  Resp 16  Ht 5' 9.5" (1.765 m)  Wt 196 lb (88.905 kg)  BMI 28.54 kg/m2  SpO2 98%  BP Readings from Last 3 Encounters:  08/09/15 124/88  08/08/15 155/98  11/29/14 134/87   Physical Exam  Constitutional: He is oriented to person, place, and time. He appears well-developed and well-nourished.  HENT:  TM's intact bilaterally, no effusions or erythema. Nares patent, nasal turbinates pink and moist, nasal passages patent. No sinus tenderness. Oropharynx clear, mucous membranes moist, dentition in good repair.  Eyes: Conjunctivae and EOM are normal. Pupils are equal, round, and reactive to light. Right eye exhibits no discharge. Left eye exhibits no discharge. No scleral icterus.  Neck: Normal range of motion. Neck supple. No thyromegaly present.  Cardiovascular: Normal rate, regular rhythm and intact distal pulses.  Exam reveals no gallop and no friction rub.   No murmur heard. Pulmonary/Chest: No stridor. No respiratory distress. He has no wheezes. He has no rales.  Abdominal: Soft. Bowel sounds are normal. He exhibits no distension and no mass. There is no tenderness.  Musculoskeletal: Normal range of motion. He exhibits no edema or tenderness.  Lymphadenopathy:    He has no cervical adenopathy.  Neurological: He is alert and oriented to person, place, and time.  Skin: Skin is warm and dry. No rash noted. No erythema. No pallor.  Psychiatric: He has a normal mood  and affect.   Results for orders placed or performed during the hospital encounter of 08/08/15 (from the past 24 hour(s))    Dept of Transp dipstick, urine     Status: None   Collection Time: 08/08/15  1:46 PM  Result Value Ref Range   Protein, ur NEGATIVE NEGATIVE mg/dL   Glucose, UA NEGATIVE NEGATIVE mg/dL   Specific Gravity, Urine 1.015 1.005 - 1.030   Hgb urine dipstick NEGATIVE NEGATIVE   Assessment and Plan :   1. Annual physical exam - Labs pending, patient is medically healthy - Discussed healthy lifestyle, diet, exercise, preventative care, vaccinations, and addressed patient's concerns.  - Repeat in 1 year.  2. Essential hypertension - Controlled, continue lisinopril, recheck in 6 months  3. Hyperlipidemia - Restart atorvastatin, lipid panel pending, recheck in 6 months  4. Need for Tdap vaccination - TDAP updated today  Jaynee Eagles, PA-C [facility] [phone] 08/09/2015  11:06 AM

## 2015-08-09 NOTE — Patient Instructions (Signed)
Keeping you healthy  Get these tests  Blood pressure- Have your blood pressure checked once a year by your healthcare provider.  Normal blood pressure is 120/80  Weight- Have your body mass index (BMI) calculated to screen for obesity.  BMI is a measure of body fat based on height and weight. You can also calculate your own BMI at [employer].  Cholesterol- Have your cholesterol checked every year.  Diabetes- Have your blood sugar checked regularly if you have high blood pressure, high cholesterol, have a family history of diabetes or if you are overweight.  Screening for Colon Cancer- Colonoscopy starting at age 31.  Screening may begin sooner depending on your family history and other health conditions. Follow up colonoscopy as directed by your Gastroenterologist.  Screening for Prostate Cancer- Both blood work (PSA) and a rectal exam help screen for Prostate Cancer.  Screening begins at age 32 with African-American men and at age 42 with Caucasian men.  Screening may begin sooner depending on your family history.  Take these medicines  Aspirin- One aspirin daily can help prevent Heart disease and Stroke.  Flu shot- Every fall.  Tetanus- Every 10 years.  Zostavax- Once after the age of 81 to prevent Shingles.  Pneumonia shot- Once after the age of 18; if you are younger than 58, ask your healthcare provider if you need a Pneumonia shot.  Take these steps  Don't smoke- If you do smoke, talk to your doctor about quitting.  For tips on how to quit, go to www.smokefree.gov or call 1-800-QUIT-NOW.  Be physically active- Exercise 5 days a week for at least 30 minutes.  If you are not already physically active start slow and gradually work up to 30 minutes of moderate physical activity.  Examples of moderate activity include walking briskly, mowing the yard, dancing, swimming, bicycling, etc.  Eat a healthy diet- Eat a variety of healthy food such as fruits, vegetables, low  fat milk, low fat cheese, yogurt, lean meant, poultry, fish, beans, tofu, etc. For more information go to www.thenutritionsource.org  Drink alcohol in moderation- Limit alcohol intake to less than two drinks a day. Never drink and drive.  Dentist- Brush and floss twice daily; visit your dentist twice a year.  Depression- Your emotional health is as important as your physical health. If you're feeling down, or losing interest in things you would normally enjoy please talk to your healthcare provider.  Eye exam- Visit your eye doctor every year.  Safe sex- If you may be exposed to a sexually transmitted infection, use a condom.  Seat belts- Seat belts can save your life; always wear one.  Smoke/Carbon Monoxide detectors- These detectors need to be installed on the appropriate level of your home.  Replace batteries at least once a year.  Skin cancer- When out in the sun, cover up and use sunscreen 15 SPF or higher.  Violence- If anyone is threatening you, please tell your healthcare provider.  Living Will/ Health care power of attorney- Speak with your healthcare provider and family.    Managing Your High Blood Pressure Blood pressure is a measurement of how forceful your blood is pressing against the walls of the arteries. Arteries are muscular tubes within the circulatory system. Blood pressure does not stay the same. Blood pressure rises when you are active, excited, or nervous; and it lowers during sleep and relaxation. If the numbers measuring your blood pressure stay above normal most of the time, you are at risk for health problems.  High blood pressure (hypertension) is a long-term (chronic) condition in which blood pressure is elevated. A blood pressure reading is recorded as two numbers, such as 120 over 80 (or 120/80). The first, higher number is called the systolic pressure. It is a measure of the pressure in your arteries as the heart beats. The second, lower number is called the  diastolic pressure. It is a measure of the pressure in your arteries as the heart relaxes between beats.  Keeping your blood pressure in a normal range is important to your overall health and prevention of health problems, such as heart disease and stroke. When your blood pressure is uncontrolled, your heart has to work harder than normal. High blood pressure is a very common condition in adults because blood pressure tends to rise with age. Men and women are equally likely to have hypertension but at different times in life. Before age 67, men are more likely to have hypertension. After 52 years of age, women are more likely to have it. Hypertension is especially common in African Americans. This condition often has no signs or symptoms. The cause of the condition is usually not known. Your caregiver can help you come up with a plan to keep your blood pressure in a normal, healthy range. BLOOD PRESSURE STAGES Blood pressure is classified into four stages: normal, prehypertension, stage 1, and stage 2. Your blood pressure reading will be used to determine what type of treatment, if any, is necessary. Appropriate treatment options are tied to these four stages:  Normal  Systolic pressure (mm Hg): below 120.  Diastolic pressure (mm Hg): below 80. Prehypertension  Systolic pressure (mm Hg): 120 to 139.  Diastolic pressure (mm Hg): 80 to 89. Stage1  Systolic pressure (mm Hg): 140 to 159.  Diastolic pressure (mm Hg): 90 to 99. Stage2  Systolic pressure (mm Hg): 160 or above.  Diastolic pressure (mm Hg): 100 or above. RISKS RELATED TO HIGH BLOOD PRESSURE Managing your blood pressure is an important responsibility. Uncontrolled high blood pressure can lead to:  A heart attack.  A stroke.  A weakened blood vessel (aneurysm).  Heart failure.  Kidney damage.  Eye damage.  Metabolic syndrome.  Memory and concentration problems. HOW TO MANAGE YOUR BLOOD PRESSURE Blood pressure can be  managed effectively with lifestyle changes and medicines (if needed). Your caregiver will help you come up with a plan to bring your blood pressure within a normal range. Your plan should include the following: Education  Read all information provided by your caregivers about how to control blood pressure.  Educate yourself on the latest guidelines and treatment recommendations. New research is always being done to further define the risks and treatments for high blood pressure. Lifestylechanges  Control your weight.  Avoid smoking.  Stay physically active.  Reduce the amount of salt in your diet.  Reduce stress.  Control any chronic conditions, such as high cholesterol or diabetes.  Reduce your alcohol intake. Medicines  Several medicines (antihypertensive medicines) are available, if needed, to bring blood pressure within a normal range. Communication  Review all the medicines you take with your caregiver because there may be side effects or interactions.  Talk with your caregiver about your diet, exercise habits, and other lifestyle factors that may be contributing to high blood pressure.  See your caregiver regularly. Your caregiver can help you create and adjust your plan for managing high blood pressure. RECOMMENDATIONS FOR TREATMENT AND FOLLOW-UP  The following recommendations are based on current guidelines  for managing high blood pressure in nonpregnant adults. Use these recommendations to identify the proper follow-up period or treatment option based on your blood pressure reading. You can discuss these options with your caregiver.  Systolic pressure of 123456 to [id number] or diastolic pressure of 80 to 89: Follow up with your caregiver as directed.  Systolic pressure of XX123456 to 0000000 or diastolic pressure of 90 to 100: Follow up with your caregiver within 2 months.  Systolic pressure above 0000000 or diastolic pressure above 123XX123: Follow up with your caregiver within 1  month.  Systolic pressure above 99991111 or diastolic pressure above [id number]: Consider antihypertensive therapy; follow up with your caregiver within 1 week.  Systolic pressure above [id number] or diastolic pressure above 123456: Begin antihypertensive therapy; follow up with your caregiver within 1 week.   This information is not intended to replace advice given to you by your health care provider. Make sure you discuss any questions you have with your health care provider.   Document Released: 06/07/2012 Document Reviewed: 06/07/2012 Elsevier Interactive Patient Education 2016 [employer].    Cholesterol Cholesterol is a white, waxy, fat-like substance needed by your body in small amounts. The liver makes all the cholesterol you need. Cholesterol is carried from the liver by the blood through the blood vessels. Deposits of cholesterol (plaque) may build up on blood vessel walls. These make the arteries narrower and stiffer. Cholesterol plaques increase the risk for heart attack and stroke.  You cannot feel your cholesterol level even if it is very high. The only way to know it is high is with a blood test. Once you know your cholesterol levels, you should keep a record of the test results. Work with your health care provider to keep your levels in the desired range.  WHAT DO THE RESULTS MEAN?  Total cholesterol is a rough measure of all the cholesterol in your blood.   LDL is the so-called bad cholesterol. This is the type that deposits cholesterol in the walls of the arteries. You want this level to be low.   HDL is the good cholesterol because it cleans the arteries and carries the LDL away. You want this level to be high.  Triglycerides are fat that the body can either burn for energy or store. High levels are closely linked to heart disease.  WHAT ARE THE DESIRED LEVELS OF CHOLESTEROL?  Total cholesterol below 200.   LDL below 100 for people at risk, below 70 for those at very high risk.    HDL above 50 is good, above 60 is best.   Triglycerides below 150.  HOW CAN I LOWER MY CHOLESTEROL?  Diet. Follow your diet programs as directed by your health care provider.   Choose fish or white meat chicken and [place], roasted or baked. Limit fatty cuts of red meat, fried foods, and processed meats, such as sausage and lunch meats.   Eat lots of fresh fruits and vegetables.  Choose whole grains, beans, pasta, potatoes, and cereals.   Use only small amounts of olive, corn, or canola oils.   Avoid butter, mayonnaise, shortening, or palm kernel oils.  Avoid foods with trans fats.   Drink skim or nonfat milk and eat low-fat or nonfat yogurt and cheeses. Avoid whole milk, cream, ice cream, egg yolks, and full-fat cheeses.   Healthy desserts include angel food cake, ginger snaps, animal crackers, hard candy, popsicles, and low-fat or nonfat frozen yogurt. Avoid pastries, cakes, pies, and cookies.   Exercise. Follow  your exercise programs as directed by your health care provider.   A regular program helps decrease LDL and raise HDL.   A regular program helps with weight control.   Do things that increase your activity level like gardening, walking, or taking the stairs. Ask your health care provider about how you can be more active in your daily life.   Medicine. Take medicine only as directed by your health care provider.   Medicine may be prescribed by your health care provider to help lower cholesterol and decrease the risk for heart disease.   If you have several risk factors, you may need medicine even if your levels are normal.   This information is not intended to replace advice given to you by your health care provider. Make sure you discuss any questions you have with your health care provider.   Document Released: 06/08/2001 Document Revised: 10/04/2014 Document Reviewed: 06/27/2013 Elsevier Interactive Patient Education [employer].

## 2015-08-10 ENCOUNTER — Encounter: Payer: Self-pay | Admitting: Urgent Care

## 2015-10-31 ENCOUNTER — Telehealth: Payer: Self-pay

## 2015-10-31 NOTE — Telephone Encounter (Signed)
Pending payment for $10.00 for 10 pages. Invoice sent on 10/31/2015.

## 2015-11-10 DIAGNOSIS — Z0271 Encounter for disability determination: Secondary | ICD-10-CM

## 2015-11-10 NOTE — Telephone Encounter (Signed)
Payment received and records faxed over on 11/10/15

## 2016-07-09 ENCOUNTER — Ambulatory Visit
Admission: EM | Admit: 2016-07-09 | Discharge: 2016-07-09 | Disposition: A | Discharge status: Home / Self Care | Payer: BLUE CROSS/BLUE SHIELD | Attending: Family Medicine | Admitting: Family Medicine

## 2016-07-09 DIAGNOSIS — M10072 Idiopathic gout, left ankle and foot: Secondary | ICD-10-CM

## 2016-07-09 MED ORDER — PREDNISONE 20 MG PO TABS: 20.0000 mg | ORAL_TABLET | Freq: Every day | ORAL | 0 refills | Status: DC

## 2016-07-09 NOTE — ED Provider Notes (Signed)
[facility]    CSN: [id number] Arrival date & time: 07/09/16  1057     History   Chief Complaint Chief Complaint  Patient presents with  . Toe Pain    HPI Troy Anderson is a 55 y.o. male.   55 yo male with a 5 days h/o left big toe pain that began suddenly, acutely without any injury or trauma. Denies any fevers, chills or skin lesions. States first day of symptoms and last night took some ibuprofen which helped his symptoms.     Toe Pain  This is a new problem. The current episode started more than 2 days ago. The problem occurs constantly.    Past Medical History:  Diagnosis Date  . Hyperlipidemia   . Hypertension     There are no active problems to display for this patient.   History reviewed. No pertinent surgical history.     Home Medications    Prior to Admission medications   Medication Sig Start Date End Date Taking? Authorizing Provider  atorvastatin (LIPITOR) 20 MG tablet Take 1 tablet (20 mg total) by mouth daily. 08/09/15   Jaynee Eagles, PA-C  lisinopril (PRINIVIL,ZESTRIL) 20 MG tablet Take 1 tablet (20 mg total) by mouth daily. 08/09/15   Jaynee Eagles, PA-C  predniSONE (DELTASONE) 20 MG tablet Take 1 tablet (20 mg total) by mouth daily. 07/09/16   Norval Gable, MD    Family History Family History  Problem Relation Age of Onset  . Cancer Mother   . Colon cancer Mother   . Stomach cancer Mother   . Diabetes Father   . Esophageal cancer Neg Hx   . Rectal cancer Neg Hx     Social History Social History  Substance Use Topics  . Smoking status: Never Smoker  . Smokeless tobacco: Never Used  . Alcohol use 0.6 oz/week    1 Standard drinks or equivalent per week     Comment: social     Allergies   Review of patient's allergies indicates no known allergies.   Review of Systems Review of Systems   Physical Exam Triage Vital Signs ED Triage Vitals  Enc Vitals Group     BP 07/09/16 1251 (!) 128/91     Pulse Rate 07/09/16  1251 75     Resp --      Temp 07/09/16 1251 97.9 F (36.6 C)     Temp Source 07/09/16 1251 Oral     SpO2 07/09/16 1251 98 %     Weight 07/09/16 1253 197 lb (89.4 kg)     Height 07/09/16 1253 5\' 10"  (1.778 m)     Head Circumference --      Peak Flow --      Pain Score 07/09/16 1320 0     Pain Loc --      Pain Edu? --      Excl. in [place]? --    No data found.   Updated Vital Signs BP (!) 128/91 (BP Location: Left Arm)   Pulse 75   Temp 97.9 F (36.6 C) (Oral)   Ht 5\' 10"  (1.778 m)   Wt 197 lb (89.4 kg)   SpO2 98%   BMI 28.27 kg/m   Visual Acuity Right Eye Distance:   Left Eye Distance:   Bilateral Distance:    Right Eye Near:   Left Eye Near:    Bilateral Near:     Physical Exam  Constitutional: He appears well-developed and well-nourished. No distress.  Musculoskeletal:  Right foot: There is tenderness (over the 1st MTP joint) and bony tenderness (over the 1st MTP joint). There is normal range of motion, no swelling, normal capillary refill, no crepitus, no deformity and no laceration.       Feet:  Skin: He is not diaphoretic.  Nursing note and vitals reviewed.    UC Treatments / Results  Labs (all labs ordered are listed, but only abnormal results are displayed) Labs Reviewed - No data to display  EKG  EKG Interpretation None       Radiology No results found.  Procedures Procedures (including critical care time)  Medications Ordered in UC Medications - No data to display   Initial Impression / Assessment and Plan / UC Course  I have reviewed the triage vital signs and the nursing notes.  Pertinent labs & imaging results that were available during my care of the patient were reviewed by me and considered in my medical decision making (see chart for details).  Clinical Course      Final Clinical Impressions(s) / UC Diagnoses   Final diagnoses:  Acute idiopathic gout involving toe of left foot    New Prescriptions Discharge  Medication List as of 07/09/2016  1:19 PM    START taking these medications   Details  predniSONE (DELTASONE) 20 MG tablet Take 1 tablet (20 mg total) by mouth daily., Starting Fri 07/09/2016, Normal       1. diagnosis reviewed with patient 2. rx as per orders above; reviewed possible side effects, interactions, risks and benefits  3. Recommend supportive treatment with rest, elevation 4. Follow-up prn if symptoms worsen or don't improve   Norval Gable, MD 07/09/16 2220

## 2016-07-09 NOTE — ED Triage Notes (Signed)
Pt started Monday night with left great toe pain. Pt with redness to joint area. Somewhat limping gait. Denies injury. Denies pain at rest.

## 2016-09-08 ENCOUNTER — Telehealth: Payer: Self-pay

## 2016-09-08 DIAGNOSIS — I10 Essential (primary) hypertension: Secondary | ICD-10-CM

## 2016-09-08 NOTE — Telephone Encounter (Signed)
Troy Anderson - Pt needs a refill on his BP medication.   Due to his insurance change he can't get his physical until January.  He wanted to go ahead and schedule, but there was not a schedule set up for you in January.  Can we please refill this for one more month?  [phone]  He now uses the [facility].

## 2016-09-09 MED ORDER — LISINOPRIL 20 MG PO TABS: 20.0000 mg | ORAL_TABLET | Freq: Every day | ORAL | 0 refills | Status: DC

## 2016-09-09 NOTE — Telephone Encounter (Signed)
IC pt.

## 2016-09-09 NOTE — Telephone Encounter (Signed)
Routed to [facility]

## 2016-09-09 NOTE — Telephone Encounter (Signed)
90 day courtesy refill provided. Please let patient know.

## 2016-11-19 ENCOUNTER — Ambulatory Visit (INDEPENDENT_AMBULATORY_CARE_PROVIDER_SITE_OTHER): Payer: BLUE CROSS/BLUE SHIELD | Admitting: Urgent Care

## 2016-11-19 VITALS — BP 130/90 | HR 77 | Temp 97.8°F | Resp 16 | Ht 69.0 in | Wt 197.0 lb

## 2016-11-19 DIAGNOSIS — I10 Essential (primary) hypertension: Secondary | ICD-10-CM | POA: Diagnosis not present

## 2016-11-19 DIAGNOSIS — Z0000 Encounter for general adult medical examination without abnormal findings: Secondary | ICD-10-CM

## 2016-11-19 DIAGNOSIS — E782 Mixed hyperlipidemia: Secondary | ICD-10-CM | POA: Diagnosis not present

## 2016-11-19 DIAGNOSIS — Z8739 Personal history of other diseases of the musculoskeletal system and connective tissue: Secondary | ICD-10-CM

## 2016-11-19 MED ORDER — LISINOPRIL 20 MG PO TABS: 20.0000 mg | ORAL_TABLET | Freq: Every day | ORAL | 3 refills | Status: DC

## 2016-11-19 MED ORDER — ATORVASTATIN CALCIUM 20 MG PO TABS: 20.0000 mg | ORAL_TABLET | Freq: Every day | ORAL | 3 refills | Status: DC

## 2016-11-19 NOTE — Progress Notes (Signed)
MRN: [id number]  Subjective:   [name] is a 56 y.o. male presenting for annual physical exam. Patient works as a Geophysicist/field seismologist, is married. Has good relationships at home, has a good support network. Patient admits unhealthy diet and occasional exercise. He is a English as a second language teacher. States that he is not interested in flu shot.  Medical care team includes: PCP: Pcp Not In System Vision: Sees Dr. Sharen Counter annually. Dental: Dental cleanings every 6 months. Specialists: Colonoscopy performed 2015, found 3 polyps, non-cancerous, recommended 5 year screening due to colon cancer in his mother.  Troy Anderson has a current medication list which includes the following prescription(s): lisinopril and atorvastatin. He has No Known Allergies.  Troy Anderson  has a past medical history of Hyperlipidemia and Hypertension. Also  has no past surgical history on file. His family history includes Cancer in his mother; Colon cancer in his mother; Diabetes in his father; Stomach cancer in his mother.  Immunizations: Tdap was 08/09/2015, refused flu vaccine today.  Review of Systems  Constitutional: Negative for chills, diaphoresis, fever, malaise/fatigue and weight loss.  HENT: Negative for congestion, ear discharge, ear pain, hearing loss, nosebleeds, sore throat and tinnitus.   Eyes: Negative for blurred vision, double vision, photophobia, pain, discharge and redness.  Respiratory: Negative for cough, shortness of breath and wheezing.   Cardiovascular: Negative for chest pain, palpitations and leg swelling.  Gastrointestinal: Negative for abdominal pain, blood in stool, constipation, diarrhea, nausea and vomiting.  Genitourinary: Negative for dysuria, flank pain, frequency, hematuria and urgency.  Musculoskeletal: Negative for back pain, joint pain and myalgias.  Skin: Negative for itching and rash.  Neurological: Negative for dizziness, tingling, seizures, loss of consciousness, weakness and headaches.  Endo/Heme/Allergies:  Negative for polydipsia.  Psychiatric/Behavioral: Negative for depression, hallucinations, memory loss, substance abuse and suicidal ideas. The patient is not nervous/anxious and does not have insomnia.    Objective:   Vitals: BP (!) 126/100   Pulse 77   Temp 97.8 F (36.6 C) (Oral)   Resp 16   Ht 5\' 9"  (1.753 m)   Wt 197 lb (89.4 kg)   SpO2 98%   BMI 29.09 kg/m   Physical Exam  Constitutional: He is oriented to person, place, and time. He appears well-developed and well-nourished.  HENT:  TM's intact bilaterally, no effusions or erythema. Nasal turbinates pink and moist, nasal passages patent. No sinus tenderness. Oropharynx clear, mucous membranes moist, dentition in good repair.  Eyes: Conjunctivae and EOM are normal. Pupils are equal, round, and reactive to light. Right eye exhibits no discharge. Left eye exhibits no discharge. No scleral icterus.  Neck: Normal range of motion. Neck supple. No thyromegaly present.  Cardiovascular: Normal rate, regular rhythm and intact distal pulses.  Exam reveals no gallop and no friction rub.   No murmur heard. Pulmonary/Chest: No stridor. No respiratory distress. He has no wheezes. He has no rales.  Abdominal: Soft. Bowel sounds are normal. He exhibits no distension and no mass. There is no tenderness.  Musculoskeletal: Normal range of motion. He exhibits no edema or tenderness.  Lymphadenopathy:    He has no cervical adenopathy.  Neurological: He is alert and oriented to person, place, and time. He has normal reflexes.  Skin: Skin is warm and dry. No rash noted. No erythema. No pallor.  Psychiatric: He has a normal mood and affect.   Assessment and Plan :   1. Annual physical exam - Labs pending, patient is medically stable. Discussed healthy lifestyle, diet, exercise, preventative care, vaccinations, and  addressed patient's concerns.   2. Essential hypertension - Labs pending, refilled lisinopril  3. Mixed hyperlipidemia - Labs  pending, refill provided.  4. History of gout - Labs pending  Jaynee Eagles, [facility] [phone] 11/19/2016  3:28 PM

## 2016-11-19 NOTE — Patient Instructions (Addendum)
Keeping you healthy  Get these tests  Blood pressure- Have your blood pressure checked once a year by your healthcare provider.  Normal blood pressure is 120/80  Weight- Have your body mass index (BMI) calculated to screen for obesity.  BMI is a measure of body fat based on height and weight. You can also calculate your own BMI at www.[facility].com/bmi/.  Cholesterol- Have your cholesterol checked every year.  Diabetes- Have your blood sugar checked regularly if you have high blood pressure, high cholesterol, have a family history of diabetes or if you are overweight.  Screening for Colon Cancer- Colonoscopy starting at age 50.  Screening may begin sooner depending on your family history and other health conditions. Follow up colonoscopy as directed by your Gastroenterologist.  Screening for Prostate Cancer- Both blood work (PSA) and a rectal exam help screen for Prostate Cancer.  Screening begins at age 40 with African-American men and at age 50 with Caucasian men.  Screening may begin sooner depending on your family history.  Take these medicines  Aspirin- One aspirin daily can help prevent Heart disease and Stroke.  Flu shot- Every fall.  Tetanus- Every 10 years.  Zostavax- Once after the age of 60 to prevent Shingles.  Pneumonia shot- Once after the age of 65; if you are younger than 65, ask your healthcare provider if you need a Pneumonia shot.  Take these steps  Don't smoke- If you do smoke, talk to your doctor about quitting.  For tips on how to quit, go to www.smokefree.gov or call 1-800-QUIT-NOW.  Be physically active- Exercise 5 days a week for at least 30 minutes.  If you are not already physically active start slow and gradually work up to 30 minutes of moderate physical activity.  Examples of moderate activity include walking briskly, mowing the yard, dancing, swimming, bicycling, etc.  Eat a healthy diet- Eat a variety of healthy food such as fruits, vegetables,  low fat milk, low fat cheese, yogurt, lean meant, poultry, fish, beans, tofu, etc. For more information go to www.thenutritionsource.org  Drink alcohol in moderation- Limit alcohol intake to less than two drinks a day. Never drink and drive.  Dentist- Brush and floss twice daily; visit your dentist twice a year.  Depression- Your emotional health is as important as your physical health. If you're feeling down, or losing interest in things you would normally enjoy please talk to your healthcare provider.  Eye exam- Visit your eye doctor every year.  Safe sex- If you may be exposed to a sexually transmitted infection, use a condom.  Seat belts- Seat belts can save your life; always wear one.  Smoke/Carbon Monoxide detectors- These detectors need to be installed on the appropriate level of your home.  Replace batteries at least once a year.  Skin cancer- When out in the sun, cover up and use sunscreen 15 SPF or higher.  Violence- If anyone is threatening you, please tell your healthcare provider.  Living Will/ Health care power of attorney- Speak with your healthcare provider and family.   IF you received an x-ray today, you will receive an invoice from [facility]. Please contact  Radiology at [phone] with questions or concerns regarding your invoice.   IF you received labwork today, you will receive an invoice from [facility]. Please contact [facility] at [phone] with questions or concerns regarding your invoice.   Our billing staff will not be able to assist you with questions regarding bills from these companies.  You will be contacted   with the lab results as soon as they are available. The fastest way to get your results is to activate your My Chart account. Instructions are located on the last page of this paperwork. If you have not heard from us regarding the results in 2 weeks, please contact this office.     

## 2016-11-20 LAB — MICROALBUMIN / CREATININE URINE RATIO
CREATININE, UR: 42.6 mg/dL
Microalbumin, Urine: <3 ug/mL

## 2016-11-20 LAB — CMP14+EGFR
A/G RATIO: 1.7 (ref 1.2–2.2)
ALT: 35 IU/L (ref 0–44)
AST: 25 IU/L (ref 0–40)
Albumin: 4.9 g/dL (ref 3.5–5.5)
Alkaline Phosphatase: 35 IU/L — ABNORMAL LOW (ref 39–117)
BUN/Creatinine Ratio: 13 (ref 9–20)
BUN: 11 mg/dL (ref 6–24)
Bilirubin Total: 0.4 mg/dL (ref 0.0–1.2)
CALCIUM: 10 mg/dL (ref 8.7–10.2)
CO2: 24 mmol/L (ref 18–29)
CREATININE: 0.85 mg/dL (ref 0.76–1.27)
Chloride: 97 mmol/L (ref 96–106)
GFR, EST AFRICAN AMERICAN: 115 mL/min/{1.73_m2} (ref >59)
GFR, EST NON AFRICAN AMERICAN: 99 mL/min/{1.73_m2} (ref >59)
Globulin, Total: 2.9 g/dL (ref 1.5–4.5)
Glucose: 86 mg/dL (ref 65–99)
POTASSIUM: 4.2 mmol/L (ref 3.5–5.2)
SODIUM: 140 mmol/L (ref 134–144)
Total Protein: 7.8 g/dL (ref 6.0–8.5)

## 2016-11-20 LAB — LIPID PANEL
CHOL/HDL RATIO: 6.1 ratio — AB (ref 0.0–5.0)
Cholesterol, Total: 286 mg/dL — ABNORMAL HIGH (ref 100–199)
HDL: 47 mg/dL (ref >39)
LDL CALC: 170 mg/dL — AB (ref 0–99)
Triglycerides: 346 mg/dL — ABNORMAL HIGH (ref 0–149)
VLDL Cholesterol Cal: 69 mg/dL — ABNORMAL HIGH (ref 5–40)

## 2016-11-20 LAB — CBC
HEMATOCRIT: 48 % (ref 37.5–51.0)
HEMOGLOBIN: 16.2 g/dL (ref 13.0–17.7)
MCH: 29.7 pg (ref 26.6–33.0)
MCHC: 33.8 g/dL (ref 31.5–35.7)
MCV: 88 fL (ref 79–97)
Platelets: 260 10*3/uL (ref 150–379)
RBC: 5.46 x10E6/uL (ref 4.14–5.80)
RDW: 13.9 % (ref 12.3–15.4)
WBC: 6.8 10*3/uL (ref 3.4–10.8)

## 2016-11-20 LAB — URIC ACID: Uric Acid: 7.3 mg/dL (ref 3.7–8.6)

## 2016-11-24 ENCOUNTER — Telehealth: Payer: Self-pay

## 2016-11-24 NOTE — Telephone Encounter (Signed)
Please review. Low priority.  Philis Fendt, MS, PA-C 11:06 AM, 11/24/2016

## 2016-11-24 NOTE — Telephone Encounter (Signed)
Pt wanted to verify meds dosage for atorvastatin. He is requesting a call back from [place].  Pt phone number is [phone].

## 2016-11-24 NOTE — Telephone Encounter (Signed)
See above

## 2016-11-24 NOTE — Telephone Encounter (Signed)
Patient is to take 2 tablets of atorvastatin 20mg  daily. A total of 40mg . Recheck lipids in 6 months.

## 2016-11-24 NOTE — Telephone Encounter (Signed)
Pt is needing to talk with someone about his cholesteral lab results   Best number [phone]

## 2016-11-25 MED ORDER — ATORVASTATIN CALCIUM 40 MG PO TABS: 40.0000 mg | ORAL_TABLET | Freq: Every day | ORAL | 1 refills | Status: DC

## 2016-11-25 NOTE — Telephone Encounter (Signed)
Pt advised.

## 2017-02-10 ENCOUNTER — Inpatient Hospital Stay
Admission: EM | Admit: 2017-02-10 | Discharge: 2017-02-12 | DRG: 247 | Disposition: A | Discharge status: Home / Self Care | Payer: BLUE CROSS/BLUE SHIELD | Attending: Internal Medicine | Admitting: Internal Medicine

## 2017-02-10 ENCOUNTER — Emergency Department: Payer: BLUE CROSS/BLUE SHIELD

## 2017-02-10 ENCOUNTER — Encounter
Admission: EM | Disposition: A | Discharge status: Home / Self Care | Payer: Self-pay | Source: Home / Self Care | Attending: Internal Medicine

## 2017-02-10 DIAGNOSIS — E669 Obesity, unspecified: Secondary | ICD-10-CM | POA: Diagnosis present

## 2017-02-10 DIAGNOSIS — Z79899 Other long term (current) drug therapy: Secondary | ICD-10-CM | POA: Diagnosis not present

## 2017-02-10 DIAGNOSIS — I25119 Atherosclerotic heart disease of native coronary artery with unspecified angina pectoris: Secondary | ICD-10-CM | POA: Diagnosis present

## 2017-02-10 DIAGNOSIS — I2119 ST elevation (STEMI) myocardial infarction involving other coronary artery of inferior wall: Principal | ICD-10-CM | POA: Diagnosis present

## 2017-02-10 DIAGNOSIS — Z6828 Body mass index (BMI) 28.0-28.9, adult: Secondary | ICD-10-CM | POA: Diagnosis not present

## 2017-02-10 DIAGNOSIS — I10 Essential (primary) hypertension: Secondary | ICD-10-CM | POA: Diagnosis present

## 2017-02-10 DIAGNOSIS — I2111 ST elevation (STEMI) myocardial infarction involving right coronary artery: Secondary | ICD-10-CM | POA: Diagnosis not present

## 2017-02-10 DIAGNOSIS — R739 Hyperglycemia, unspecified: Secondary | ICD-10-CM | POA: Diagnosis present

## 2017-02-10 DIAGNOSIS — E785 Hyperlipidemia, unspecified: Secondary | ICD-10-CM | POA: Diagnosis present

## 2017-02-10 HISTORY — PX: LEFT HEART CATH AND CORONARY ANGIOGRAPHY: CATH118249

## 2017-02-10 LAB — CBC
HCT: 43.6 % (ref 40.0–52.0)
Hemoglobin: 14.7 g/dL (ref 13.0–18.0)
MCH: 30 pg (ref 26.0–34.0)
MCHC: 33.8 g/dL (ref 32.0–36.0)
MCV: 88.6 fL (ref 80.0–100.0)
PLATELETS: 232 10*3/uL (ref 150–440)
RBC: 4.92 MIL/uL (ref 4.40–5.90)
RDW: 13.7 % (ref 11.5–14.5)
WBC: 7 10*3/uL (ref 3.8–10.6)

## 2017-02-10 LAB — BASIC METABOLIC PANEL
Anion gap: 9 (ref 5–15)
BUN: 16 mg/dL (ref 6–20)
CALCIUM: 9 mg/dL (ref 8.9–10.3)
CO2: 26 mmol/L (ref 22–32)
CREATININE: 1 mg/dL (ref 0.61–1.24)
Chloride: 103 mmol/L (ref 101–111)
GFR calc non Af Amer: >60 mL/min (ref >60)
Glucose, Bld: 184 mg/dL — ABNORMAL HIGH (ref 65–99)
Potassium: 3.7 mmol/L (ref 3.5–5.1)
SODIUM: 138 mmol/L (ref 135–145)

## 2017-02-10 LAB — LIPID PANEL
CHOL/HDL RATIO: 3.8 ratio
Cholesterol: 161 mg/dL (ref 0–200)
HDL: 42 mg/dL (ref >40)
LDL CALC: 57 mg/dL (ref 0–99)
Triglycerides: 312 mg/dL — ABNORMAL HIGH (ref <150)
VLDL: 62 mg/dL — ABNORMAL HIGH (ref 0–40)

## 2017-02-10 LAB — TROPONIN I
TROPONIN I: 30.56 ng/mL — AB (ref <0.03)
TROPONIN I: 25.3 ng/mL — AB (ref <0.03)
Troponin I: 24.23 ng/mL (ref <0.03)

## 2017-02-10 LAB — POCT ACTIVATED CLOTTING TIME: Activated Clotting Time: 411 seconds

## 2017-02-10 LAB — CARDIAC CATHETERIZATION: Cath EF Quantitative: 55 %

## 2017-02-10 LAB — MRSA PCR SCREENING: MRSA BY PCR: NEGATIVE

## 2017-02-10 LAB — GLUCOSE, CAPILLARY: Glucose-Capillary: 132 mg/dL — ABNORMAL HIGH (ref 65–99)

## 2017-02-10 SURGERY — LEFT HEART CATH AND CORONARY ANGIOGRAPHY
Anesthesia: Moderate Sedation

## 2017-02-10 MED ORDER — NITROGLYCERIN 5 MG/ML IV SOLN
INTRAVENOUS | Status: AC
  Filled 2017-02-10: qty 10

## 2017-02-10 MED ORDER — ACETAMINOPHEN 325 MG PO TABS: 650.0000 mg | ORAL_TABLET | ORAL | Status: DC | PRN

## 2017-02-10 MED ORDER — HYDRALAZINE HCL 20 MG/ML IJ SOLN: 5.0000 mg | INTRAMUSCULAR | Status: AC | PRN

## 2017-02-10 MED ORDER — NITROGLYCERIN 0.4 MG SL SUBL
SUBLINGUAL_TABLET | SUBLINGUAL | Status: AC
  Administered 2017-02-10: 0.4 mg
  Filled 2017-02-10: qty 3

## 2017-02-10 MED ORDER — MORPHINE SULFATE (PF) 2 MG/ML IV SOLN
2.0000 mg | Freq: Once | INTRAVENOUS | Status: AC
  Administered 2017-02-10: 2 mg via INTRAVENOUS

## 2017-02-10 MED ORDER — ONDANSETRON HCL 4 MG/2ML IJ SOLN: 4.0000 mg | Freq: Four times a day (QID) | INTRAMUSCULAR | Status: DC | PRN

## 2017-02-10 MED ORDER — MORPHINE SULFATE (PF) 2 MG/ML IV SOLN
INTRAVENOUS | Status: AC
  Administered 2017-02-10: 2 mg via INTRAVENOUS
  Filled 2017-02-10: qty 1

## 2017-02-10 MED ORDER — ASPIRIN 81 MG PO CHEW
CHEWABLE_TABLET | ORAL | Status: AC
  Administered 2017-02-10: 324 mg
  Filled 2017-02-10: qty 4

## 2017-02-10 MED ORDER — CLOPIDOGREL BISULFATE 75 MG PO TABS
75.0000 mg | ORAL_TABLET | Freq: Every day | ORAL | Status: DC
  Administered 2017-02-10 – 2017-02-12 (×3): 75 mg via ORAL
  Filled 2017-02-10 (×3): qty 1

## 2017-02-10 MED ORDER — ONDANSETRON HCL 4 MG/2ML IJ SOLN
INTRAMUSCULAR | Status: AC
  Administered 2017-02-10: 4 mg via INTRAVENOUS
  Filled 2017-02-10: qty 2

## 2017-02-10 MED ORDER — METOPROLOL SUCCINATE ER 25 MG PO TB24
25.0000 mg | ORAL_TABLET | Freq: Every day | ORAL | Status: DC
  Administered 2017-02-11 – 2017-02-12 (×2): 25 mg via ORAL
  Filled 2017-02-10 (×3): qty 1

## 2017-02-10 MED ORDER — ATORVASTATIN CALCIUM 20 MG PO TABS
80.0000 mg | ORAL_TABLET | Freq: Every day | ORAL | Status: DC
  Administered 2017-02-10 – 2017-02-11 (×2): 80 mg via ORAL
  Filled 2017-02-10 (×2): qty 4

## 2017-02-10 MED ORDER — SODIUM CHLORIDE 0.9 % WEIGHT BASED INFUSION: 1.0000 mL/kg/h | INTRAVENOUS | Status: AC

## 2017-02-10 MED ORDER — BIVALIRUDIN TRIFLUOROACETATE 250 MG IV SOLR
INTRAVENOUS | Status: AC
  Filled 2017-02-10: qty 250

## 2017-02-10 MED ORDER — CLOPIDOGREL BISULFATE 75 MG PO TABS
600.0000 mg | ORAL_TABLET | Freq: Once | ORAL | Status: AC
  Administered 2017-02-10: 600 mg via ORAL

## 2017-02-10 MED ORDER — FENTANYL CITRATE (PF) 100 MCG/2ML IJ SOLN
INTRAMUSCULAR | Status: DC | PRN
  Administered 2017-02-10: 50 ug via INTRAVENOUS

## 2017-02-10 MED ORDER — SODIUM CHLORIDE 0.9% FLUSH: 3.0000 mL | INTRAVENOUS | Status: DC | PRN

## 2017-02-10 MED ORDER — ONDANSETRON HCL 4 MG/2ML IJ SOLN
4.0000 mg | Freq: Once | INTRAMUSCULAR | Status: AC
  Administered 2017-02-10: 4 mg via INTRAVENOUS

## 2017-02-10 MED ORDER — FENTANYL CITRATE (PF) 100 MCG/2ML IJ SOLN
INTRAMUSCULAR | Status: AC
  Filled 2017-02-10: qty 2

## 2017-02-10 MED ORDER — HEPARIN SODIUM (PORCINE) 1000 UNIT/ML IJ SOLN
4000.0000 [IU] | Freq: Once | INTRAMUSCULAR | Status: AC
  Administered 2017-02-10: 4000 [IU] via INTRAVENOUS

## 2017-02-10 MED ORDER — BIVALIRUDIN BOLUS VIA INFUSION - CUPID
INTRAVENOUS | Status: DC | PRN
  Administered 2017-02-10: 66.375 mg via INTRAVENOUS

## 2017-02-10 MED ORDER — IOPAMIDOL (ISOVUE-300) INJECTION 61%
INTRAVENOUS | Status: DC | PRN
  Administered 2017-02-10: 175 mL via INTRA_ARTERIAL

## 2017-02-10 MED ORDER — ASPIRIN 81 MG PO CHEW
81.0000 mg | CHEWABLE_TABLET | Freq: Every day | ORAL | Status: DC
  Administered 2017-02-10 – 2017-02-12 (×3): 81 mg via ORAL
  Filled 2017-02-10 (×3): qty 1

## 2017-02-10 MED ORDER — SODIUM CHLORIDE 0.9% FLUSH
3.0000 mL | Freq: Two times a day (BID) | INTRAVENOUS | Status: DC
  Administered 2017-02-10 – 2017-02-12 (×4): 3 mL via INTRAVENOUS

## 2017-02-10 MED ORDER — LABETALOL HCL 5 MG/ML IV SOLN: 10.0000 mg | INTRAVENOUS | Status: AC | PRN

## 2017-02-10 MED ORDER — HEPARIN (PORCINE) IN NACL 2-0.9 UNIT/ML-% IJ SOLN
INTRAMUSCULAR | Status: AC | PRN
  Administered 2017-02-10: 1000 mL via INTRA_ARTERIAL

## 2017-02-10 MED ORDER — SODIUM CHLORIDE 0.9 % IV SOLN
Freq: Once | INTRAVENOUS | Status: AC
  Administered 2017-02-10: 05:00:00 via INTRAVENOUS

## 2017-02-10 MED ORDER — SODIUM CHLORIDE 0.9 % IV SOLN: 250.0000 mL | INTRAVENOUS | Status: DC | PRN

## 2017-02-10 MED ORDER — LISINOPRIL 10 MG PO TABS
10.0000 mg | ORAL_TABLET | Freq: Every day | ORAL | Status: DC
  Administered 2017-02-10 – 2017-02-12 (×3): 10 mg via ORAL
  Filled 2017-02-10 (×3): qty 1

## 2017-02-10 MED ORDER — SODIUM CHLORIDE 0.9 % IV SOLN
0.2500 mg/kg/h | INTRAVENOUS | Status: AC
  Filled 2017-02-10: qty 250

## 2017-02-10 MED ORDER — LIDOCAINE HCL (PF) 1 % IJ SOLN
INTRAMUSCULAR | Status: AC
  Filled 2017-02-10: qty 30

## 2017-02-10 MED ORDER — SODIUM CHLORIDE 0.9 % IV SOLN
INTRAVENOUS | Status: AC | PRN
  Administered 2017-02-10: 1.75 mg/kg/h via INTRAVENOUS

## 2017-02-10 SURGICAL SUPPLY — 13 items
BALLN TREK RX 3.0X15 (BALLOONS) ×3
BALLOON TREK RX 3.0X15 (BALLOONS) ×1 IMPLANT
CATH INFINITI 5FR ANG PIGTAIL (CATHETERS) ×3 IMPLANT
CATH INFINITI 5FR JL4 (CATHETERS) ×3 IMPLANT
CATH VISTA GUIDE 6FR JR4 (CATHETERS) ×3 IMPLANT
DEVICE INFLAT 30 PLUS (MISCELLANEOUS) ×3 IMPLANT
KIT MANI 3VAL PERCEP (MISCELLANEOUS) ×3 IMPLANT
NEEDLE PERC 18GX7CM (NEEDLE) ×3 IMPLANT
PACK CARDIAC CATH (CUSTOM PROCEDURE TRAY) ×3 IMPLANT
SHEATH PINNACLE 6F 10CM (SHEATH) ×3 IMPLANT
STENT XIENCE ALPINE RX 3.25X23 (Permanent Stent) ×3 IMPLANT
WIRE EMERALD 3MM-J .035X150CM (WIRE) ×3 IMPLANT
WIRE G HI TQ BMW 190 (WIRE) ×3 IMPLANT

## 2017-02-10 NOTE — Progress Notes (Signed)
Name: Troy Anderson MRN: [id number] DOB: 07-10-1961    ADMISSION DATE:  02/10/2017  Consulting physician: Dr. Marcille Blanco for chest pain.   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  Patient presented to ED with midsternal chest pain.  EKG  Was concerning for Inferior wall MI. Patient was taken to the cath lab and underwent PCI and stent  With DES.  Patient was sent to the ICU for observation.  PAST MEDICAL HISTORY :   has a past medical history of Hyperlipidemia and Hypertension.  has no past surgical history on file. Prior to Admission medications   Medication Sig Start Date End Date Taking? Authorizing Provider  atorvastatin (LIPITOR) 20 MG tablet Take 1 tablet (20 mg total) by mouth daily. 11/19/16   Jaynee Eagles, PA-C  atorvastatin (LIPITOR) 40 MG tablet Take 1 tablet (40 mg total) by mouth daily. 11/25/16   Jaynee Eagles, PA-C  lisinopril (PRINIVIL,ZESTRIL) 20 MG tablet Take 1 tablet (20 mg total) by mouth daily. 11/19/16   Jaynee Eagles, PA-C   No Known Allergies  FAMILY HISTORY:  family history includes Cancer in his mother; Colon cancer in his mother; Diabetes in his father; Stomach cancer in his mother. SOCIAL HISTORY:  reports that he has never smoked. He has never used smokeless tobacco. He reports that he does not drink alcohol or use drugs.  REVIEW OF SYSTEMS:   Constitutional: Negative for fever, chills, weight loss, malaise/fatigue and diaphoresis.  HENT: Negative for hearing loss, ear pain, nosebleeds, congestion, sore throat, neck pain, tinnitus and ear discharge.   Eyes: Negative for  blurred vision, double vision, photophobia, pain, discharge and redness.  Respiratory: Negative for cough, hemoptysis, sputum production, shortness of breath, wheezing and stridor.   Cardiovascular: Negative for chest pain, palpitations, orthopnea, claudication, leg swelling and PND.  Gastrointestinal: Negative for heartburn, nausea, vomiting, abdominal pain, diarrhea, constipation, blood in stool and melena.  Genitourinary: Negative for dysuria, urgency, frequency, hematuria and flank pain.  Musculoskeletal: Negative for myalgias, back pain, joint pain and falls.  Skin: Negative for itching and rash.  Neurological: Negative for dizziness, tingling, tremors, sensory change, speech change, focal weakness, seizures, loss of consciousness, weakness and headaches.  Endo/Heme/Allergies: Negative for environmental allergies and polydipsia. Does not bruise/bleed easily.  SUBJECTIVE:  Patient states that "he  Feels better at this time"  VITAL SIGNS: Temp:  [97.6 F (36.4 C)] 97.6 F (36.4 C) (05/17 0443) Pulse Rate:  [64-73] 64 (05/17 0520) Resp:  [13-18] 18 (05/17 0520) BP: (116-151)/(72-99) 116/76 (05/17 0520) SpO2:  [97 %-100 %] 99 % (05/17 0520) Weight:  [195 lb (88.5 kg)] 195 lb (88.5 kg) (05/17 0444)  PHYSICAL EXAMINATION: General: Middle aged male, in no acute distress. Neuro: Awake, Alert and oriented HEENT:  AT,[place], No jvd Cardiovascular:  S1S2,regular, no m/r/g Lungs:  Clear bilaterally, no wheezes,crackles, rhonchi noted Abdomen:  Soft, NT,nd Musculoskeletal:  No edema, cyanosis Skin:  Warm ,dry and intact   Recent Labs Lab 02/10/17 0446  NA 138  K 3.7  CL 103  CO2 26  BUN 16  CREATININE 1.00  GLUCOSE 184*    Recent Labs Lab 02/10/17 0446  HGB 14.7  HCT 43.6  WBC 7.0  PLT 232   Dg Chest Portable 1 View  Result Date: 02/10/2017 CLINICAL DATA:  Chest pain EXAM: PORTABLE CHEST 1 VIEW COMPARISON:  None. FINDINGS: The heart size and mediastinal contours are within  normal limits. Both lungs are clear. The visualized skeletal structures are unremarkable. IMPRESSION: No active disease. Electronically Signed   By: Ulyses Jarred M.D.   On: 02/10/2017 05:06    ASSESSMENT / PLAN:  Inferior wall MI- S/P PCI and stent to RCA with DES Hypertension Hyperlipidemia Mild obesity  Plan Continue Aspirin Continue Atorvastatin Bival per cardiologist Continue Plavix Continue Lisinopril  Bincy Varughese,AG-ACNP  Pulmonary and [place]   02/10/2017, 6:49 AM  Pt seen and examined with NP, agree with assessment and plan and findings. Currently patient is awake, alert, has no new complaints. Lungs are clear to ausculation bilaterally. I personally review CXR images which shows normal lungs.  The patient experienced an inferior wall MI, he is now s/p PCI with stent, his chest pain is signfiicantly improved. Continue current medications and monitor.   Marda Stalker, M.D. 02/10/2017

## 2017-02-10 NOTE — Progress Notes (Signed)
Discontinued Angio-max at 8:30, sheath removed at 10:30. Groin is intact with 2 plus pulses. Patient tolerating diet and using urinal. No complaints of pain. Awaiting for transfer to floor.

## 2017-02-10 NOTE — Progress Notes (Signed)
eLink Physician-Brief Progress Note Patient Name: Zyen Triggs DOB: 1960/10/14 MRN: [id number]   Date of Service  02/10/2017  HPI/Events of Note  STEMI, S/P stent to RCA  eICU Interventions  Care plan reviewed. No changes made     Intervention Category Evaluation Type: New Patient Evaluation  Wilhelmina Mcardle 02/10/2017, 6:52 AM

## 2017-02-10 NOTE — Progress Notes (Signed)
Dr Nehemiah Massed notified of patients troponin level. At this time patient does not have any pain. No new orders given.

## 2017-02-10 NOTE — ED Notes (Signed)
Pt arrives to ED from work via [employer] with c/o chest pain. 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 Pt denies any previous cardiac history, (+) for HTN and hyperlipidemia. Pt denies any c/o N/V, but does report (+) lightheadedness and weakness. Pt is pale and diaphoretic upon arrival; Dr Owens Shark present immediately to bedside.

## 2017-02-10 NOTE — ED Provider Notes (Signed)
[facility] Emergency Department Provider Note _   First MD Initiated Contact with Patient 02/10/17 [phone]     (approximate)  I have reviewed the triage vital signs and the nursing notes.   HISTORY  Chief Complaint Chest Pain    HPI Troy Anderson is a 56 y.o. male with history of hypertension hyperlipidemia presents via EMS to the emergency department with 8 out of 10 central chest pain that is described as pressure nonradiating. Patient admits to right arm numbness. Patient denies any dyspnea no legpain or swelling. Review of the EKG performed by EMS concerning for ST segment elevation greater than 1 mm in leads 3 and aVF   Past Medical History:  Diagnosis Date  . Hyperlipidemia   . Hypertension     Patient Active Problem List   Diagnosis Date Noted  . Acute ST elevation myocardial infarction (STEMI) involving right coronary artery without development of Q waves ([place]) 02/10/2017    History reviewed. No pertinent surgical history.  Prior to Admission medications   Medication Sig Start Date End Date Taking? Authorizing Provider  atorvastatin (LIPITOR) 20 MG tablet Take 1 tablet (20 mg total) by mouth daily. 11/19/16   Jaynee Eagles, PA-C  atorvastatin (LIPITOR) 40 MG tablet Take 1 tablet (40 mg total) by mouth daily. 11/25/16   Jaynee Eagles, PA-C  lisinopril (PRINIVIL,ZESTRIL) 20 MG tablet Take 1 tablet (20 mg total) by mouth daily. 11/19/16   Jaynee Eagles, PA-C    Allergies No known drug allergies  Family History  Problem Relation Age of Onset  . Cancer Mother   . Colon cancer Mother   . Stomach cancer Mother   . Diabetes Father   . Esophageal cancer Neg Hx   . Rectal cancer Neg Hx     Social History Social History  Substance Use Topics  . Smoking status: Never Smoker  . Smokeless tobacco: Never Used  . Alcohol use No     Comment: social    Review of Systems Constitutional: No fever/chills Eyes: No visual changes. ENT: No sore  throat. Cardiovascular: Positive for chest pain. Respiratory: Denies shortness of breath. Gastrointestinal: No abdominal pain.  No nausea, no vomiting.  No diarrhea.  No constipation. Genitourinary: Negative for dysuria. Musculoskeletal: Negative for neck pain.  Negative for back pain. Integumentary: Negative for rash. Neurological: Negative for headaches, focal weakness or numbness.   ____________________________________________   PHYSICAL EXAM:  VITAL SIGNS: ED Triage Vitals  Enc Vitals Group     BP 02/10/17 0443 (!) 151/87     Pulse Rate 02/10/17 0443 64     Resp 02/10/17 0443 16     Temp 02/10/17 0443 97.6 F (36.4 C)     Temp Source 02/10/17 0443 Oral     SpO2 02/10/17 0443 100 %     Weight 02/10/17 0444 195 lb (88.5 kg)     Height 02/10/17 0444 5\' 9"  (1.753 m)     Head Circumference --      Peak Flow --      Pain Score 02/10/17 0443 5     Pain Loc --      Pain Edu? --      Excl. in [place]? --     Constitutional: Alert and oriented. Diaphoretic  Eyes: Conjunctivae are normal.  Head: Atraumatic. Mouth/Throat: Mucous membranes are moist.  Oropharynx non-erythematous. Neck: No stridor.   Cardiovascular: Normal rate, regular rhythm. Good peripheral circulation. Grossly normal heart sounds. Respiratory: Normal respiratory effort.  No retractions.  Lungs CTAB. Gastrointestinal: Soft and nontender. No distention.  Musculoskeletal: No lower extremity tenderness nor edema. No gross deformities of extremities. Neurologic:  Normal speech and language. No gross focal neurologic deficits are appreciated.  Skin:  Skin is warm, dry and intact. No rash noted. Psychiatric: Mood and affect are normal. Speech and behavior are normal.  ____________________________________________   LABS (all labs ordered are listed, but only abnormal results are displayed)  Labs Reviewed  BASIC METABOLIC PANEL - Abnormal; Notable for the following:       Result Value   Glucose, Bld 184 (*)    All  other components within normal limits  GLUCOSE, CAPILLARY - Abnormal; Notable for the following:    Glucose-Capillary 132 (*)    All other components within normal limits  MRSA PCR SCREENING  CBC  TROPONIN I  POCT ACTIVATED CLOTTING TIME   ____________________________________________  EKG  ED ECG REPORT I, [place] Lianny Molter, the attending physician, personally viewed and interpreted this ECG.   Date: 02/10/2017  EKG Time: 4:37 AM  Rate: 59  Rhythm: Normal sinus rhythm  Axis: None  Intervals: Normal  ST&T Change: 2 mm ST segment elevations in leads 3 and aVF ___________________________________  RADIOLOGY I, [place] Kelleen Stolze, personally viewed and evaluated these images (plain radiographs) as part of my medical decision making, as well as reviewing the written report by the radiologist.  Dg Chest Portable 1 View  Result Date: 02/10/2017 CLINICAL DATA:  Chest pain EXAM: PORTABLE CHEST 1 VIEW COMPARISON:  None. FINDINGS: The heart size and mediastinal contours are within normal limits. Both lungs are clear. The visualized skeletal structures are unremarkable. IMPRESSION: No active disease. Electronically Signed   By: Ulyses Jarred M.D.   On: 02/10/2017 05:06    ____________________________________________   PROCEDURES  Critical Care performed: CRITICAL CARE Performed by: Gregor Hams   Total critical care time: 30 minutes  Critical care time was exclusive of separately billable procedures and treating other patients.  Critical care was necessary to treat or prevent imminent or life-threatening deterioration.  Critical care was time spent personally by me on the following activities: development of treatment plan with patient and/or surrogate as well as nursing, discussions with consultants, evaluation of patient's response to treatment, examination of patient, obtaining history from patient or surrogate, ordering and performing treatments and interventions, ordering and  review of laboratory studies, ordering and review of radiographic studies, pulse oximetry and re-evaluation of patient's condition.      Procedures   ____________________________________________   INITIAL IMPRESSION / ASSESSMENT AND PLAN / ED COURSE  Pertinent labs & imaging results that were available during my care of the patient were reviewed by me and considered in my medical decision making (see chart for details).  Review of the EKG from EMS revealed ST segment elevation in leads 3 and aVF consistent with EKG performed in the emergency department. Patient given aspirin and heparin and Plavix in the emergency department. Patient discussed with Dr. Clayborn Bigness who agreed with ST segment elevation in inferior leads. Patient taken to the Cath Lab      ____________________________________________  FINAL CLINICAL IMPRESSION(S) / ED DIAGNOSES  Final diagnoses:  Acute ST elevation myocardial infarction (STEMI) involving other coronary artery of inferior wall (HCC)     MEDICATIONS GIVEN DURING THIS VISIT:  Medications  labetalol (NORMODYNE,TRANDATE) injection 10 mg (not administered)  hydrALAZINE (APRESOLINE) injection 5 mg (not administered)  acetaminophen (TYLENOL) tablet 650 mg (not administered)  ondansetron (ZOFRAN) injection 4 mg (not  administered)  0.9% sodium chloride infusion (not administered)  sodium chloride flush (NS) 0.9 % injection 3 mL (not administered)  sodium chloride flush (NS) 0.9 % injection 3 mL (not administered)  0.9 %  sodium chloride infusion (not administered)  aspirin chewable tablet 81 mg (not administered)  clopidogrel (PLAVIX) tablet 75 mg (not administered)  bivalirudin (ANGIOMAX) 250 mg in sodium chloride 0.9 % 50 mL (5 mg/mL) infusion (not administered)  aspirin 81 MG chewable tablet (324 mg  Given 02/10/17 0441)  nitroGLYCERIN (NITROSTAT) 0.4 MG SL tablet (0.4 mg  Given 02/10/17 0441)  heparin injection 4,000 Units (4,000 Units Intravenous  Given 02/10/17 0452)  0.9 %  sodium chloride infusion ( Intravenous New Bag/Given 02/10/17 0453)  morphine 2 MG/ML injection 2 mg (2 mg Intravenous Given 02/10/17 0500)  ondansetron (ZOFRAN) injection 4 mg (4 mg Intravenous Given 02/10/17 0500)  morphine 2 MG/ML injection 2 mg (2 mg Intravenous Given 02/10/17 0518)  clopidogrel (PLAVIX) tablet 600 mg (600 mg Oral Given 02/10/17 0522)  bivalirudin (ANGIOMAX) 250 mg in sodium chloride 0.9 % 50 mL (5 mg/mL) infusion (0.25 mg/kg/hr  88.5 kg  Rate/Dose Change 02/10/17 0610)  heparin infusion 2 units/mL in 0.9 % sodium chloride (1,000 mLs Intra-arterial New Bag/Given 02/10/17 0619)     NEW OUTPATIENT MEDICATIONS STARTED DURING THIS VISIT:  Current Discharge Medication List      Current Discharge Medication List      Current Discharge Medication List       Note:  This document was prepared using Dragon voice recognition software and may include unintentional dictation errors.    Gregor Hams, MD 02/10/17 [id number]

## 2017-02-10 NOTE — Consult Note (Signed)
Reason for Consult: STEMI inferior wall Referring Physician: Dr. Owens Shark emergency room  Troy Anderson is an 56 y.o. male.  HPI: Patient is a 56 year old black male works as a Administrator for [employer] started having chest pain on the job midsternal around to the pain did not go away got progressively worse probably came to emergency room via EMS. Initial EKG and EMS at around 4 :11 was suggestive of a STEMI. Repeat EKG in emergency room was evaluated by Dr. Owens Shark then shared the EKG with me for an opinion and at that point I told him to activate the code STEMI. Patient was subsequently brought to the emergency room treated with heparin and aspirin subsequently got Plavix. Patient was then brought to the catheter lab from have a totally distal occluded right coronary artery in separately received a PCI and stent with DES transferred to ICU. Hospitalist was consulted for medical care  Past Medical History:  Diagnosis Date  . Hyperlipidemia   . Hypertension     History reviewed. No pertinent surgical history.  Family History  Problem Relation Age of Onset  . Cancer Mother   . Colon cancer Mother   . Stomach cancer Mother   . Diabetes Father   . Esophageal cancer Neg Hx   . Rectal cancer Neg Hx     Social History:  reports that he has never smoked. He has never used smokeless tobacco. He reports that he does not drink alcohol or use drugs.  Allergies: No Known Allergies  Medications: I have reviewed the patient's current medications.  Results for orders placed or performed during the hospital encounter of 02/10/17 (from the past 48 hour(s))  Basic metabolic panel     Status: Abnormal   Collection Time: 02/10/17  4:46 AM  Result Value Ref Range   Sodium 138 135 - 145 mmol/L   Potassium 3.7 3.5 - 5.1 mmol/L   Chloride 103 101 - 111 mmol/L   CO2 26 22 - 32 mmol/L   Glucose, Bld 184 (H) 65 - 99 mg/dL   BUN 16 6 - 20 mg/dL   Creatinine, Ser 1.00 0.61 - 1.24 mg/dL   Calcium 9.0 8.9 -  10.3 mg/dL   GFR calc non Af Amer >60 >60 mL/min   GFR calc Af Amer >60 >60 mL/min    Comment: (NOTE) The eGFR has been calculated using the CKD EPI equation. This calculation has not been validated in all clinical situations. eGFR's persistently <60 mL/min signify possible Chronic Kidney Disease.    Anion gap 9 5 - 15  CBC     Status: None   Collection Time: 02/10/17  4:46 AM  Result Value Ref Range   WBC 7.0 3.8 - 10.6 K/uL   RBC 4.92 4.40 - 5.90 MIL/uL   Hemoglobin 14.7 13.0 - 18.0 g/dL   HCT 43.6 40.0 - 52.0 %   MCV 88.6 80.0 - 100.0 fL   MCH 30.0 26.0 - 34.0 pg   MCHC 33.8 32.0 - 36.0 g/dL   RDW 13.7 11.5 - 14.5 %   Platelets 232 150 - 440 K/uL  Troponin I     Status: None   Collection Time: 02/10/17  4:46 AM  Result Value Ref Range   Troponin I <0.03 <0.03 ng/mL    Dg Chest Portable 1 View  Result Date: 02/10/2017 CLINICAL DATA:  Chest pain EXAM: PORTABLE CHEST 1 VIEW COMPARISON:  None. FINDINGS: The heart size and mediastinal contours are within normal limits. Both lungs are  clear. The visualized skeletal structures are unremarkable. IMPRESSION: No active disease. Electronically Signed   By: Ulyses Jarred M.D.   On: 02/10/2017 05:06    Review of Systems  Constitutional: Positive for diaphoresis and malaise/fatigue.  HENT: Negative.   Eyes: Negative.   Respiratory: Positive for shortness of breath.   Cardiovascular: Positive for chest pain.  Gastrointestinal: Negative.   Genitourinary: Negative.   Musculoskeletal: Negative.   Skin: Negative.   Endo/Heme/Allergies: Negative.   Psychiatric/Behavioral: Negative.    Blood pressure 116/76, pulse 64, temperature 97.6 F (36.4 C), temperature source Oral, resp. rate 18, height 5' 9"  (1.753 m), weight 88.5 kg (195 lb), SpO2 99 %. Physical Exam  Nursing note and vitals reviewed. Constitutional: He is oriented to person, place, and time. He appears well-developed and well-nourished.  HENT:  Head: Normocephalic and  atraumatic.  Eyes: Conjunctivae and EOM are normal. Pupils are equal, round, and reactive to light.  Neck: Normal range of motion. Neck supple.  Cardiovascular: Normal rate, regular rhythm and normal heart sounds.   Respiratory: Effort normal and breath sounds normal.  GI: Soft. Bowel sounds are normal.  Musculoskeletal: Normal range of motion.  Neurological: He is alert and oriented to person, place, and time. He has normal reflexes.  Skin: Skin is warm and dry.  Psychiatric: He has a normal mood and affect.    Assessment/Plan: STEMI inferior wall Hypertension Hyperlipidemia Mild obesity Abnormal EKG . Plan Status post PCI and stent to RCA with DES Admit to ICU for recovery Aspirin Plavix Increase Lipitor to 80 mg once a day Start beta-blockade therapy and metoprolol 25 mg succinate once a day Reduce lisinopril to 10 mg once a day Follow-up enzymes and EKG Hospital was consulted for medical therapy For the patient to cardiac rehabilitation Anticipate discharge 2-3 days  Silvano Garofano D Ripley Bogosian 02/10/2017, 6:43 AM

## 2017-02-10 NOTE — H&P (Addendum)
[name] at [name] NAME: Troy Anderson    MR#:  [id number]  DATE OF BIRTH:  1961-02-11  DATE OF ADMISSION:  02/10/2017  PRIMARY CARE PHYSICIAN: Jaynee Eagles   REQUESTING/REFERRING PHYSICIAN: Dr. Clayborn Bigness  CHIEF COMPLAINT:   Chest pain HISTORY OF PRESENT ILLNESS:  Troy Anderson  is a 56 y.o. male with a known history of Essential hypertension and hyperlipidemia who presents with above complaint. Patient arrived via EMS to the emergency department complaining of 10 out of 10 substernal chest pain without radiation associated with right arm numbness and no shortness of breath, nausea or vomiting. Patient reports that when he left the [employer] [place] his chest pain began. It climbed to an 8 out of 10. When he arrived at [facility] his pain had resolved and he was able to do his work however shortly after he again had 10 out of 10 chest pain and therefore was brought to the ER for further evaluation. In the emergency room EKG shows ST elevation in leads 3 and aVF. He went to cardiac catheterization where he now has a stent placed in the RCA.  PAST MEDICAL HISTORY:   Past Medical History:  Diagnosis Date  . Hyperlipidemia   . Hypertension     PAST SURGICAL HISTORY:  None  SOCIAL HISTORY:   Social History  Substance Use Topics  . Smoking status: Never Smoker  . Smokeless tobacco: Never Used  . Alcohol use No     Comment: social    FAMILY HISTORY:   Family History  Problem Relation Age of Onset  . Cancer Mother   . Colon cancer Mother   . Stomach cancer Mother   . Diabetes Father   . Esophageal cancer Neg Hx   . Rectal cancer Neg Hx     DRUG ALLERGIES:  No Known Allergies  REVIEW OF SYSTEMS:   Review of Systems  Constitutional: Negative.  Negative for chills, fever and malaise/fatigue.  HENT: Negative.  Negative for ear discharge, ear pain, hearing loss, nosebleeds and sore throat.   Eyes:  Negative.  Negative for blurred vision and pain.  Respiratory: Negative.  Negative for cough, hemoptysis, shortness of breath and wheezing.   Cardiovascular: Negative.  Negative for chest pain, palpitations and leg swelling.  Gastrointestinal: Negative.  Negative for abdominal pain, blood in stool, diarrhea, nausea and vomiting.  Genitourinary: Negative.  Negative for dysuria.  Musculoskeletal: Negative.  Negative for back pain.  Skin: Negative.   Neurological: Negative for dizziness, tremors, speech change, focal weakness, seizures and headaches.  Endo/Heme/Allergies: Negative.  Does not bruise/bleed easily.  Psychiatric/Behavioral: Negative.  Negative for depression, hallucinations and suicidal ideas.    MEDICATIONS AT HOME:   Prior to Admission medications   Medication Sig Start Date End Date Taking? Authorizing Provider  atorvastatin (LIPITOR) 20 MG tablet Take 1 tablet (20 mg total) by mouth daily. 11/19/16   Jaynee Eagles, PA-C  atorvastatin (LIPITOR) 40 MG tablet Take 1 tablet (40 mg total) by mouth daily. 11/25/16   Jaynee Eagles, PA-C  lisinopril (PRINIVIL,ZESTRIL) 20 MG tablet Take 1 tablet (20 mg total) by mouth daily. 11/19/16   Jaynee Eagles, PA-C      VITAL SIGNS:  Blood pressure 116/76, pulse 64, temperature 97.6 F (36.4 C), temperature source Oral, resp. rate 18, height 5\' 9"  (1.753 m), weight 88.5 kg (195 lb), SpO2 99 %.  PHYSICAL EXAMINATION:   Physical Exam  Constitutional: He is oriented to person, place,  and time and well-developed, well-nourished, and in no distress. No distress.  HENT:  Head: Normocephalic.  Eyes: No scleral icterus.  Neck: Normal range of motion. Neck supple. No JVD present. No tracheal deviation present.  Cardiovascular: Normal rate, regular rhythm and normal heart sounds.  Exam reveals no gallop and no friction rub.   No murmur heard. Pulmonary/Chest: Effort normal and breath sounds normal. No respiratory distress. He has no wheezes. He has no  rales. He exhibits no tenderness.  Abdominal: Soft. Bowel sounds are normal. He exhibits no distension and no mass. There is no tenderness. There is no rebound and no guarding.  Musculoskeletal: Normal range of motion. He exhibits no edema.  Neurological: He is alert and oriented to person, place, and time.  Skin: Skin is warm. No rash noted. No erythema.  Psychiatric: Affect and judgment normal.      LABORATORY PANEL:   CBC  Recent Labs Lab 02/10/17 0446  WBC 7.0  HGB 14.7  HCT 43.6  PLT 232   ------------------------------------------------------------------------------------------------------------------  Chemistries   Recent Labs Lab 02/10/17 0446  NA 138  K 3.7  CL 103  CO2 26  GLUCOSE 184*  BUN 16  CREATININE 1.00  CALCIUM 9.0   ------------------------------------------------------------------------------------------------------------------  Cardiac Enzymes  Recent Labs Lab 02/10/17 0446  TROPONINI <0.03   ------------------------------------------------------------------------------------------------------------------  RADIOLOGY:  Dg Chest Portable 1 View  Result Date: 02/10/2017 CLINICAL DATA:  Chest pain EXAM: PORTABLE CHEST 1 VIEW COMPARISON:  None. FINDINGS: The heart size and mediastinal contours are within normal limits. Both lungs are clear. The visualized skeletal structures are unremarkable. IMPRESSION: No active disease. Electronically Signed   By: Ulyses Jarred M.D.   On: 02/10/2017 05:06    EKG:   ST elevation in inferior leads  IMPRESSION AND PLAN:   56 year old male with a history of essential hypertension and hyperlipidemia presented chest pain upon of ST elevation MI.  1. ST elevation MI: Patient went immediately to cardiac catheterization lab where he had a stent placed in RCA. He is on aspirin, Plavix, lisinopril and metoprolol in addition to atorvastatin. Continue to trend troponins. Check hemoglobin A1c and lipid  panel  2. Essential hypertension: Continue lisinopril and metoprolol with adjustments as needed to control the pressure ideally less than 130/80.  3. Essential hyperlipidemia: Continue Lipitor and check lipid panel 4. Hyperglycemia: Check hemoglobin A1c.    All the records are reviewed and case discussed with ED provider. Management plans discussed with the patient and he in agreement  CODE STATUS: full  TOTAL TIME TAKING CARE OF THIS PATIENT: 45 minutes.    Cidney Kirkwood M.D on 02/10/2017 at 6:58 AM  Between 7am to 6pm - Pager - [phone]  After 6pm go to [email or web address] - password [email or web address] [facility]  Office  [phone]  CC: Primary care physician; Jaynee Eagles MD

## 2017-02-10 NOTE — Progress Notes (Signed)
Paged Dr Nehemiah Massed regarding patient troponin level.

## 2017-02-10 NOTE — Progress Notes (Signed)
Spoke with Dr Nehemiah Massed regarding troponin level. At this point no additional orders. Patient has no complaints of pain.

## 2017-02-10 NOTE — ED Notes (Signed)
Dr. Clayborn Bigness at bedside to evaluate patient.

## 2017-02-11 DIAGNOSIS — I219 Acute myocardial infarction, unspecified: Secondary | ICD-10-CM

## 2017-02-11 HISTORY — DX: Acute myocardial infarction, unspecified: I21.9

## 2017-02-11 LAB — CBC
HEMATOCRIT: 43.2 % (ref 40.0–52.0)
Hemoglobin: 14.7 g/dL (ref 13.0–18.0)
MCH: 29.4 pg (ref 26.0–34.0)
MCHC: 34 g/dL (ref 32.0–36.0)
MCV: 86.6 fL (ref 80.0–100.0)
PLATELETS: 236 10*3/uL (ref 150–440)
RBC: 4.99 MIL/uL (ref 4.40–5.90)
RDW: 13.6 % (ref 11.5–14.5)
WBC: 8 10*3/uL (ref 3.8–10.6)

## 2017-02-11 LAB — BASIC METABOLIC PANEL
Anion gap: 7 (ref 5–15)
BUN: 11 mg/dL (ref 6–20)
CHLORIDE: 104 mmol/L (ref 101–111)
CO2: 26 mmol/L (ref 22–32)
CREATININE: 0.93 mg/dL (ref 0.61–1.24)
Calcium: 9 mg/dL (ref 8.9–10.3)
GFR calc Af Amer: >60 mL/min (ref >60)
GFR calc non Af Amer: >60 mL/min (ref >60)
Glucose, Bld: 108 mg/dL — ABNORMAL HIGH (ref 65–99)
POTASSIUM: 3.9 mmol/L (ref 3.5–5.1)
Sodium: 137 mmol/L (ref 135–145)

## 2017-02-11 LAB — HEMOGLOBIN A1C
Hgb A1c MFr Bld: 6 % — ABNORMAL HIGH (ref 4.8–5.6)
Mean Plasma Glucose: 126 mg/dL

## 2017-02-11 NOTE — Progress Notes (Signed)
[place] at [place] NAME: Troy Anderson    MR#:  [id number]  DATE OF BIRTH:  [birth year]  SUBJECTIVE:   patient without chest pain  REVIEW OF SYSTEMS:    Review of Systems  Constitutional: Negative for fever, chills weight loss HENT: Negative for ear pain, nosebleeds, congestion, facial swelling, rhinorrhea, neck pain, neck stiffness and ear discharge.   Respiratory: Negative for cough, shortness of breath, wheezing  Cardiovascular: Negative for chest pain, palpitations and leg swelling.  Gastrointestinal: Negative for heartburn, abdominal pain, vomiting, diarrhea or consitpation Genitourinary: Negative for dysuria, urgency, frequency, hematuria Musculoskeletal: Negative for back pain or joint pain Neurological: Negative for dizziness, seizures, syncope, focal weakness,  numbness and headaches.  Hematological: Does not bruise/bleed easily.  Psychiatric/Behavioral: Negative for hallucinations, confusion, dysphoric mood    Tolerating Diet: yes      DRUG ALLERGIES:  No Known Allergies  VITALS:  Blood pressure 117/68, pulse 79, temperature 97.9 F (36.6 C), temperature source Oral, resp. rate (!) 9, height 5\' 9"  (1.753 m), weight 88.5 kg (195 lb), SpO2 96 %.  PHYSICAL EXAMINATION:  Constitutional: Appears well-developed and well-nourished. No distress. HENT: Normocephalic. Marland Kitchen Oropharynx is clear and moist.  Eyes: Conjunctivae and EOM are normal. PERRLA, no scleral icterus.  Neck: Normal ROM. Neck supple. No JVD. No tracheal deviation. CVS: RRR, S1/S2 +, no murmurs, no gallops, no carotid bruit.  Pulmonary: Effort and breath sounds normal, no stridor, rhonchi, wheezes, rales.  Abdominal: Soft. BS +,  no distension, tenderness, rebound or guarding.  Musculoskeletal: Normal range of motion. No edema and no tenderness.  Neuro: Alert. CN 2-12 grossly intact. No focal deficits. Skin: Skin is warm and dry. No rash noted. Psychiatric:  Normal mood and affect.      LABORATORY PANEL:   CBC  Recent Labs Lab 02/11/17 0423  WBC 8.0  HGB 14.7  HCT 43.2  PLT 236   ------------------------------------------------------------------------------------------------------------------  Chemistries   Recent Labs Lab 02/11/17 0423  NA 137  K 3.9  CL 104  CO2 26  GLUCOSE 108*  BUN 11  CREATININE 0.93  CALCIUM 9.0   ------------------------------------------------------------------------------------------------------------------  Cardiac Enzymes  Recent Labs Lab 02/10/17 1050 02/10/17 1651 02/10/17 2245  TROPONINI 24.23* 30.56* 25.30*   ------------------------------------------------------------------------------------------------------------------  RADIOLOGY:  Dg Chest Portable 1 View  Result Date: 02/10/2017 CLINICAL DATA:  Chest pain EXAM: PORTABLE CHEST 1 VIEW COMPARISON:  None. FINDINGS: The heart size and mediastinal contours are within normal limits. Both lungs are clear. The visualized skeletal structures are unremarkable. IMPRESSION: No active disease. Electronically Signed   By: Ulyses Jarred M.D.   On: 02/10/2017 05:06     ASSESSMENT AND PLAN:   56 year old male with a history of essential hypertension and hyperlipidemia presented chest pain upon of ST elevation MI.  1. ST elevation MI: Patient went immediately to cardiac catheterization lab where he had a stent placed in RCA. He is on aspirin, Plavix, lisinopril and metoprolol in addition to atorvastatin. Troponin max 30.   2. Essential hypertension: Continue lisinopril and metoprolol with adjustments as needed to control the pressure ideally less than 130/80.  3. Essential hyperlipidemia: Continue Lipitor   4. Hyperglycemia: Hemoglobin A1c 6.0      Management plans discussed with the patient and he is in agreement.  CODE STATUS: full  TOTAL TIME TAKING CARE OF THIS PATIENT: 30 minutes.     POSSIBLE D/C tomorrow,  DEPENDING ON CLINICAL CONDITION.   Akansha Wyche M.D on 02/11/2017  at 8:53 AM  Between 7am to 6pm - Pager - [phone] After 6pm go to [email or web address] - password [email or web address] [facility]  Office  [phone]  CC: Primary care physician; System, Pcp Not In  Note: This dictation was prepared with Dragon dictation along with smaller phrase technology. Any transcriptional errors that result from this process are unintentional.

## 2017-02-11 NOTE — Plan of Care (Signed)
Problem: Activity: Goal: Ability to tolerate increased activity will improve Outcome: Progressing Up to chair today

## 2017-02-11 NOTE — Progress Notes (Signed)
Transfer pt ambulatory to room 250. No pain. vss stable remained on moniter

## 2017-02-11 NOTE — Progress Notes (Signed)
Subjective:  Patient sitting up in chair feels well no chest pain no groin issues ambulating in the room without difficulty palpitations or tachycardia..  Objective:  Vital Signs in the last 24 hours: Temp:  [97.9 F (36.6 C)-98.3 F (36.8 C)] 97.9 F (36.6 C) (05/18 0749) Pulse Rate:  [56-90] 79 (05/18 0444) Resp:  [9-17] 9 (05/18 0444) BP: (98-149)/(58-93) 117/68 (05/18 0749) SpO2:  [94 %-100 %] 96 % (05/18 0444)  Intake/Output from previous day: 05/17 0701 - 05/18 0700 In: 600 [P.O.:600] Out: 1500 [Urine:1500] Intake/Output from this shift: No intake/output data recorded.  Physical Exam: General appearance: appears stated age Lungs: clear to auscultation bilaterally Heart: regular rate and rhythm, S1, S2 normal, no murmur, click, rub or gallop Abdomen: soft, non-tender; bowel sounds normal; no masses,  no organomegaly Extremities: extremities normal, atraumatic, no cyanosis or edema Pulses: 2+ and symmetric Neurologic: Alert and oriented X 3, normal strength and tone. Normal symmetric reflexes. Normal coordination and gait  Lab Results:  Recent Labs  02/10/17 0446 02/11/17 0423  WBC 7.0 8.0  HGB 14.7 14.7  PLT 232 236    Recent Labs  02/10/17 0446 02/11/17 0423  NA 138 137  K 3.7 3.9  CL 103 104  CO2 26 26  GLUCOSE 184* 108*  BUN 16 11  CREATININE 1.00 0.93    Recent Labs  02/10/17 1651 02/10/17 2245  TROPONINI 30.56* 25.30*   Hepatic Function Panel No results for input(s): PROT, ALBUMIN, AST, ALT, ALKPHOS, BILITOT, BILIDIR, IBILI in the last 72 hours.  Recent Labs  02/10/17 0446  CHOL 161   No results for input(s): PROTIME in the last 72 hours.  Imaging: Imaging results have been reviewed  Cardiac Studies:  Assessment/Plan: Status post STEMI inferior wall Status post PCI and stent DES Angina Chest Pain Coronary Artery Disease Coronary Artery Stent MI  Hypertension  Hyperlipidemia  Borderline obesity  . Plan  Postop day 1 from  PCI and stent  Status post DES continue Plavix and aspiri  LOS: 1 day    Dwayne D Callwood 02/11/2017, 9:10 AM    Status post MI continue ACE inhibitor beta blocker  aspirin Plavix and  Hyperlipidemia increase Lipitor to 80 mg once a day  Hypertension we'll treat with lisinopril 10 mg a day  metoprolol 25 mg once a day  Recommend ambulation in the halls  Refer to patient to cardiac rehabilitation  Return to work in 6 weeks No lifting for at least 2 weeks Have the patient follow-up in cardiology in 2 weeks  [place] 02/11/2017 9:16am

## 2017-02-12 MED ORDER — CLOPIDOGREL BISULFATE 75 MG PO TABS: 75.0000 mg | ORAL_TABLET | Freq: Every day | ORAL | 0 refills | Status: DC

## 2017-02-12 MED ORDER — NITROGLYCERIN 0.4 MG SL SUBL: 0.4000 mg | SUBLINGUAL_TABLET | SUBLINGUAL | 0 refills | Status: AC | PRN

## 2017-02-12 MED ORDER — ASPIRIN 81 MG PO CHEW: 81.0000 mg | CHEWABLE_TABLET | Freq: Every day | ORAL | Status: DC

## 2017-02-12 MED ORDER — ATORVASTATIN CALCIUM 80 MG PO TABS: 80.0000 mg | ORAL_TABLET | Freq: Every day | ORAL | 0 refills | Status: DC

## 2017-02-12 MED ORDER — METOPROLOL SUCCINATE ER 25 MG PO TB24: 25.0000 mg | ORAL_TABLET | Freq: Every day | ORAL | 0 refills | Status: DC

## 2017-02-12 MED ORDER — LISINOPRIL 10 MG PO TABS: 10.0000 mg | ORAL_TABLET | Freq: Every day | ORAL | 0 refills | Status: DC

## 2017-02-12 NOTE — Progress Notes (Signed)
[place] at [place] was admitted to the Hospital on 02/10/2017 and Discharged  02/12/2017 and should be excused from work/school   for 14 days starting 02/10/2017 , may return to work/school without any restrictions.  Call Bettey Costa MD with questions.  Dianna Ewald M.D on 02/12/2017,at 9:33 AM  [place] at [facility]  [phone]

## 2017-02-12 NOTE — Discharge Instructions (Signed)

## 2017-02-12 NOTE — Discharge Summary (Signed)
[facility] [place] NAME: Troy Anderson    MR#:  [id number]  DATE OF BIRTH:  1960-11-05  DATE OF ADMISSION:  02/10/2017 ADMITTING PHYSICIAN: Yolonda Kida, MD  DATE OF DISCHARGE: 02/12/2017  PRIMARY CARE PHYSICIAN: Dr Jaynee Eagles   ADMISSION DIAGNOSIS:  Acute ST elevation myocardial infarction (STEMI) involving right coronary artery without development of Q waves (HCC) [I21.11]  DISCHARGE DIAGNOSIS:  Active Problems:   Acute ST elevation myocardial infarction (STEMI) involving right coronary artery without development of Q waves ([place])   SECONDARY DIAGNOSIS:   Past Medical History:  Diagnosis Date  . Hyperlipidemia   . Hypertension     HOSPITAL COURSE:  56 year old male with a history of essential hypertension and hyperlipidemia presented chest pain upon of ST elevation MI.  1. ST elevation MI: Patient went immediately to cardiac catheterization lab where he had a stent placed in RCA. He is on aspirin, Plavix, lisinopril and metoprolol in addition to atorvastatin. Troponin max was 30. Groin site without bruising or hematoma. He will be discharged with follow-up with cardiology in 1 week. He was given strict instructions not to lift weight greater than 15 pounds or heavy exertion.  2. Essential hypertension: Continue lisinopril and metoprolol with adjustments as needed to control the pressure ideally less than 130/80.  3. Essential hyperlipidemia: Continue Lipitor   4. Hyperglycemia: Hemoglobin A1c 6.0   DISCHARGE CONDITIONS AND DIET:   Stable for discharge on heart healthy diet  CONSULTS OBTAINED:    DRUG ALLERGIES:  No Known Allergies  DISCHARGE MEDICATIONS:   Current Discharge Medication List    START taking these medications   Details  aspirin 81 MG chewable tablet Chew 1 tablet (81 mg total) by mouth daily.    clopidogrel (PLAVIX) 75 MG tablet Take 1 tablet (75 mg total) by mouth daily with  breakfast. Qty: 30 tablet, Refills: 0    metoprolol succinate (TOPROL-XL) 25 MG 24 hr tablet Take 1 tablet (25 mg total) by mouth daily. Qty: 60 tablet, Refills: 0    nitroGLYCERIN (NITROSTAT) 0.4 MG SL tablet Place 1 tablet (0.4 mg total) under the tongue every 5 (five) minutes as needed for chest pain. Qty: 30 tablet, Refills: 0      CONTINUE these medications which have CHANGED   Details  atorvastatin (LIPITOR) 80 MG tablet Take 1 tablet (80 mg total) by mouth daily at 6 PM. Qty: 30 tablet, Refills: 0    lisinopril (PRINIVIL,ZESTRIL) 10 MG tablet Take 1 tablet (10 mg total) by mouth daily. Qty: 30 tablet, Refills: 0          Today   CHIEF COMPLAINT:   Doing well without chest pain no arrhythmia noted on telemetry   VITAL SIGNS:  Blood pressure 131/75, pulse 73, temperature 98.3 F (36.8 C), resp. rate 18, height 5\' 9"  (1.753 m), weight 88.5 kg (195 lb), SpO2 98 %.   REVIEW OF SYSTEMS:  Review of Systems  Constitutional: Negative.  Negative for chills, fever and malaise/fatigue.  HENT: Negative.  Negative for ear discharge, ear pain, hearing loss, nosebleeds and sore throat.   Eyes: Negative.  Negative for blurred vision and pain.  Respiratory: Negative.  Negative for cough, hemoptysis, shortness of breath and wheezing.   Cardiovascular: Negative.  Negative for chest pain, palpitations and leg swelling.  Gastrointestinal: Negative.  Negative for abdominal pain, blood in stool, diarrhea, nausea and vomiting.  Genitourinary: Negative.  Negative for dysuria.  Musculoskeletal: Negative.  Negative  for back pain.  Skin: Negative.   Neurological: Negative for dizziness, tremors, speech change, focal weakness, seizures and headaches.  Endo/Heme/Allergies: Negative.  Does not bruise/bleed easily.  Psychiatric/Behavioral: Negative.  Negative for depression, hallucinations and suicidal ideas.     PHYSICAL EXAMINATION:  GENERAL:  56 y.o.-year-old patient lying in the bed  with no acute distress.  NECK:  Supple, no jugular venous distention. No thyroid enlargement, no tenderness.  LUNGS: Normal breath sounds bilaterally, no wheezing, rales,rhonchi  No use of accessory muscles of respiration.  CARDIOVASCULAR: S1, S2 normal. No murmurs, rubs, or gallops.  ABDOMEN: Soft, non-tender, non-distended. Bowel sounds present. No organomegaly or mass.  EXTREMITIES: No pedal edema, cyanosis, or clubbing.  PSYCHIATRIC: The patient is alert and oriented x 3.  SKIN: No obvious rash, lesion, or ulcer.  Groin site without hematoma  DATA REVIEW:   CBC  Recent Labs Lab 02/11/17 0423  WBC 8.0  HGB 14.7  HCT 43.2  PLT 236    Chemistries   Recent Labs Lab 02/11/17 0423  NA 137  K 3.9  CL 104  CO2 26  GLUCOSE 108*  BUN 11  CREATININE 0.93  CALCIUM 9.0    Cardiac Enzymes  Recent Labs Lab 02/10/17 1050 02/10/17 1651 02/10/17 2245  TROPONINI 24.23* 30.56* 25.30*    Microbiology Results  @MICRORSLT48 @  RADIOLOGY:  No results found.    Current Discharge Medication List    START taking these medications   Details  aspirin 81 MG chewable tablet Chew 1 tablet (81 mg total) by mouth daily.    clopidogrel (PLAVIX) 75 MG tablet Take 1 tablet (75 mg total) by mouth daily with breakfast. Qty: 30 tablet, Refills: 0    metoprolol succinate (TOPROL-XL) 25 MG 24 hr tablet Take 1 tablet (25 mg total) by mouth daily. Qty: 60 tablet, Refills: 0    nitroGLYCERIN (NITROSTAT) 0.4 MG SL tablet Place 1 tablet (0.4 mg total) under the tongue every 5 (five) minutes as needed for chest pain. Qty: 30 tablet, Refills: 0      CONTINUE these medications which have CHANGED   Details  atorvastatin (LIPITOR) 80 MG tablet Take 1 tablet (80 mg total) by mouth daily at 6 PM. Qty: 30 tablet, Refills: 0    lisinopril (PRINIVIL,ZESTRIL) 10 MG tablet Take 1 tablet (10 mg total) by mouth daily. Qty: 30 tablet, Refills: 0         Aspirin prescribed at discharge?   Yes High Intensity Statin Prescribed? (Lipitor 40-80mg  or Crestor 20-40mg ): Yes Beta Blocker Prescribed? Yes For EF <40%, was ACEI/ARB Prescribed? Yes ADP Receptor Inhibitor Prescribed? (i.e. Plavix etc.-Includes Medically Managed Patients): No: n/a For EF <40%, Aldosterone Inhibitor Prescribed? No: n/a Was EF assessed during THIS hospitalization? Yes Was Cardiac Rehab II ordered? (Included Medically managed Patients): Yes   Management plans discussed with the patient and he is in agreement. Stable for discharge home  Patient should follow up with pcp and cardiology  CODE STATUS:     Code Status Orders        Start     Ordered   02/10/17 0623  Full code  Continuous     02/10/17 0622    Code Status History    Date Active Date Inactive Code Status Order ID Comments User Context   This patient has a current code status but no historical code status.      TOTAL TIME TAKING CARE OF THIS PATIENT: 37 minutes.    Note: This dictation was prepared  with Dragon dictation along with smaller phrase technology. Any transcriptional errors that result from this process are unintentional.  Bronislaw Switzer M.D on 02/12/2017 at 8:15 AM  Between 7am to 6pm - Pager - [phone] After 6pm go to [email or web address] - password [email or web address] [facility]  Office  [phone]  CC: Primary care physician; System, Pcp Not In

## 2017-03-24 ENCOUNTER — Encounter: Payer: Self-pay | Admitting: *Deleted

## 2017-03-24 ENCOUNTER — Encounter: Payer: BLUE CROSS/BLUE SHIELD | Attending: Internal Medicine | Admitting: *Deleted

## 2017-03-24 VITALS — Ht 69.6 in | Wt 196.5 lb

## 2017-03-24 DIAGNOSIS — I213 ST elevation (STEMI) myocardial infarction of unspecified site: Secondary | ICD-10-CM | POA: Diagnosis not present

## 2017-03-24 NOTE — Progress Notes (Signed)
Cardiac Individual Treatment Plan  Patient Details  Name: Frans Valente MRN: [id number] Date of Birth: Jan 03, 1961 Referring Provider:     Cardiac Rehab from 03/24/2017 in [facility] Cardiac and Pulmonary Rehab  Referring Provider  Barrie Lyme MD      Initial Encounter Date:    Cardiac Rehab from 03/24/2017 in [facility] Cardiac and Pulmonary Rehab  Date  03/24/17  Referring Provider  Barrie Lyme MD      Visit Diagnosis: ST elevation myocardial infarction (STEMI), unspecified artery ([place])  Patient's Home Medications on Admission:  Current Outpatient Prescriptions:  .  aspirin 81 MG chewable tablet, Chew 1 tablet (81 mg total) by mouth daily., Disp: , Rfl:  .  atorvastatin (LIPITOR) 80 MG tablet, Take 1 tablet (80 mg total) by mouth daily at 6 PM., Disp: 30 tablet, Rfl: 0 .  clopidogrel (PLAVIX) 75 MG tablet, Take 1 tablet (75 mg total) by mouth daily with breakfast., Disp: 30 tablet, Rfl: 0 .  lisinopril (PRINIVIL,ZESTRIL) 10 MG tablet, Take 1 tablet (10 mg total) by mouth daily., Disp: 30 tablet, Rfl: 0 .  metoprolol succinate (TOPROL-XL) 25 MG 24 hr tablet, Take 1 tablet (25 mg total) by mouth daily., Disp: 60 tablet, Rfl: 0 .  nitroGLYCERIN (NITROSTAT) 0.4 MG SL tablet, Place 1 tablet (0.4 mg total) under the tongue every 5 (five) minutes as needed for chest pain., Disp: 30 tablet, Rfl: 0  Past Medical History: Past Medical History:  Diagnosis Date  . Hyperlipidemia   . Hypertension     Tobacco Use: History  Smoking Status  . Never Smoker  Smokeless Tobacco  . Never Used    Labs: Recent Review Flowsheet Data    Labs for ITP Cardiac and Pulmonary Rehab Latest Ref Rng & Units 09/14/2014 08/09/2015 11/19/2016 02/10/2017   Cholestrol 0 - 200 mg/dL 225(H) 245(H) 286(H) 161   LDLCALC 0 - 99 mg/dL 139(H) 153(H) 170(H) 57   HDL >40 mg/dL 46 51 47 42   Trlycerides <150 mg/dL 199(H) 207(H) 346(H) 312(H)   Hemoglobin A1c 4.8 - 5.6 % - - - 6.0(H)       Exercise Target  Goals: Date: 03/24/17  Exercise Program Goal: Individual exercise prescription set with THRR, safety & activity barriers. Participant demonstrates ability to understand and report RPE using BORG scale, to self-measure pulse accurately, and to acknowledge the importance of the exercise prescription.  Exercise Prescription Goal: Starting with aerobic activity 30 plus minutes a day, 3 days per week for initial exercise prescription. Provide home exercise prescription and guidelines that participant acknowledges understanding prior to discharge.  Activity Barriers & Risk Stratification:     Activity Barriers & Cardiac Risk Stratification - 03/24/17 1508      Activity Barriers & Cardiac Risk Stratification   Activity Barriers None   Cardiac Risk Stratification Moderate      6 Minute Walk:     6 Minute Walk    Row Name 03/24/17 1518         6 Minute Walk   Phase Initial     Distance 2230 feet     Walk Time 6 minutes     # of Rest Breaks 0     MPH 4.22     METS 5.65     RPE 8     VO2 Peak 19.76     Symptoms No     Resting HR 69 bpm     Resting BP 128/74     Max Ex. HR 120 bpm  Max Ex. BP 140/74     2 Minute Post BP 128/70        Oxygen Initial Assessment:   Oxygen Re-Evaluation:   Oxygen Discharge (Final Oxygen Re-Evaluation):   Initial Exercise Prescription:     Initial Exercise Prescription - 03/24/17 1500      Date of Initial Exercise RX and Referring Provider   Date 03/24/17   Referring Provider Barrie Lyme MD     Treadmill   MPH 4.2   Grade 2.5   Minutes 15   METs 5.66     Elliptical   Level 2   Speed 5   Minutes 15     T5 Nustep   Level 5   SPM 100   Minutes 15   METs 5     Prescription Details   Frequency (times per week) 3   Duration Progress to 45 minutes of aerobic exercise without signs/symptoms of physical distress     Intensity   THRR 40-80% of Max Heartrate 108-147   Ratings of Perceived Exertion 11-13   Perceived  Dyspnea 0-4     Progression   Progression Continue to progress workloads to maintain intensity without signs/symptoms of physical distress.     Resistance Training   Training Prescription Yes   Weight 6 lbs   Reps 10-15      Perform Capillary Blood Glucose checks as needed.  Exercise Prescription Changes:     Exercise Prescription Changes    Row Name 03/24/17 1500             Response to Exercise   Blood Pressure (Admit) 128/74       Blood Pressure (Exercise) 140/74       Blood Pressure (Exit) 128/70       Heart Rate (Admit) 69 bpm       Heart Rate (Exercise) 120 bpm       Heart Rate (Exit) 88 bpm       Oxygen Saturation (Admit) 99 %       Oxygen Saturation (Exercise) 98 %       Rating of Perceived Exertion (Exercise) 8       Symptoms none       Comments walk test results          Exercise Comments:   Exercise Goals and Review:     Exercise Goals    Row Name 03/24/17 1520             Exercise Goals   Increase Physical Activity Yes       Intervention Provide advice, education, support and counseling about physical activity/exercise needs.;Develop an individualized exercise prescription for aerobic and resistive training based on initial evaluation findings, risk stratification, comorbidities and participant's personal goals.       Expected Outcomes Achievement of increased cardiorespiratory fitness and enhanced flexibility, muscular endurance and strength shown through measurements of functional capacity and personal statement of participant.       Increase Strength and Stamina Yes       Intervention Provide advice, education, support and counseling about physical activity/exercise needs.;Develop an individualized exercise prescription for aerobic and resistive training based on initial evaluation findings, risk stratification, comorbidities and participant's personal goals.       Expected Outcomes Achievement of increased cardiorespiratory fitness and  enhanced flexibility, muscular endurance and strength shown through measurements of functional capacity and personal statement of participant.          Exercise Goals Re-Evaluation :   Discharge Exercise  Prescription (Final Exercise Prescription Changes):     Exercise Prescription Changes - 03/24/17 1500      Response to Exercise   Blood Pressure (Admit) 128/74   Blood Pressure (Exercise) 140/74   Blood Pressure (Exit) 128/70   Heart Rate (Admit) 69 bpm   Heart Rate (Exercise) 120 bpm   Heart Rate (Exit) 88 bpm   Oxygen Saturation (Admit) 99 %   Oxygen Saturation (Exercise) 98 %   Rating of Perceived Exertion (Exercise) 8   Symptoms none   Comments walk test results      Nutrition:  Target Goals: Understanding of nutrition guidelines, daily intake of sodium 1500mg , cholesterol 200mg , calories 30% from fat and 7% or less from saturated fats, daily to have 5 or more servings of fruits and vegetables.  Biometrics:     Pre Biometrics - 03/24/17 1520      Pre Biometrics   Height 5' 9.6" (1.768 m)   Weight 196 lb 8 oz (89.1 kg)   Waist Circumference 37.75 inches   Hip Circumference 38.25 inches   Waist to Hip Ratio 0.99 %   BMI (Calculated) 28.6   Single Leg Stand 30 seconds       Nutrition Therapy Plan and Nutrition Goals:   Nutrition Discharge: Rate Your Plate Scores:     Nutrition Assessments - 03/24/17 1446      MEDFICTS Scores   Pre Score 6      Nutrition Goals Re-Evaluation:   Nutrition Goals Discharge (Final Nutrition Goals Re-Evaluation):   Psychosocial: Target Goals: Acknowledge presence or absence of significant depression and/or stress, maximize coping skills, provide positive support system. Participant is able to verbalize types and ability to use techniques and skills needed for reducing stress and depression.   Initial Review & Psychosocial Screening:     Initial Psych Review & Screening - 03/24/17 1459      Initial Review    Current issues with None Identified     Family Dynamics   Good Support System? Yes  Wife and children      Barriers   Psychosocial barriers to participate in program There are no identifiable barriers or psychosocial needs.     Screening Interventions   Interventions Encouraged to exercise;Program counselor consult      Quality of Life Scores:      Quality of Life - 03/24/17 1500      Quality of Life Scores   Health/Function Pre 20.73 %   Socioeconomic Pre 20.29 %   Psych/Spiritual Pre 21 %   Family Pre 21 %   GLOBAL Pre 20.74 %      PHQ-9: Recent Review Flowsheet Data    Depression screen [facility] 2/9 03/24/2017 11/19/2016 08/09/2015   Decreased Interest 0 0 0   Down, Depressed, Hopeless 0 0 0   PHQ - 2 Score 0 0 0   Altered sleeping 0 - -   Tired, decreased energy 0 - -   Change in appetite 0 - -   Feeling bad or failure about yourself  0 - -   Trouble concentrating 0 - -   Moving slowly or fidgety/restless 0 - -   Suicidal thoughts 0 - -   PHQ-9 Score 0 - -   Difficult doing work/chores Not difficult at all - -     Interpretation of Total Score  Total Score Depression Severity:  1-4 = Minimal depression, 5-9 = Mild depression, 10-14 = Moderate depression, 15-19 = Moderately severe depression, 20-27 = Severe depression  Psychosocial Evaluation and Intervention:   Psychosocial Re-Evaluation:   Psychosocial Discharge (Final Psychosocial Re-Evaluation):   Vocational Rehabilitation: Provide vocational rehab assistance to qualifying candidates.   Vocational Rehab Evaluation & Intervention:     Vocational Rehab - 03/24/17 1506      Initial Vocational Rehab Evaluation & Intervention   Assessment shows need for Vocational Rehabilitation No      Education: Education Goals: Education classes will be provided on a weekly basis, covering required topics. Participant will state understanding/return demonstration of topics presented.  Learning  Barriers/Preferences:     Learning Barriers/Preferences - 03/24/17 1504      Learning Barriers/Preferences   Learning Barriers None   Learning Preferences None      Education Topics: General Nutrition Guidelines/Fats and Fiber: -Group instruction provided by verbal, written material, models and posters to present the general guidelines for heart healthy nutrition. Gives an explanation and review of dietary fats and fiber.   Controlling Sodium/Reading Food Labels: -Group verbal and written material supporting the discussion of sodium use in heart healthy nutrition. Review and explanation with models, verbal and written materials for utilization of the food label.   Exercise Physiology & Risk Factors: - Group verbal and written instruction with models to review the exercise physiology of the cardiovascular system and associated critical values. Details cardiovascular disease risk factors and the goals associated with each risk factor.   Aerobic Exercise & Resistance Training: - Gives group verbal and written discussion on the health impact of inactivity. On the components of aerobic and resistive training programs and the benefits of this training and how to safely progress through these programs.   Flexibility, Balance, General Exercise Guidelines: - Provides group verbal and written instruction on the benefits of flexibility and balance training programs. Provides general exercise guidelines with specific guidelines to those with heart or lung disease. Demonstration and skill practice provided.   Stress Management: - Provides group verbal and written instruction about the health risks of elevated stress, cause of high stress, and healthy ways to reduce stress.   Depression: - Provides group verbal and written instruction on the correlation between heart/lung disease and depressed mood, treatment options, and the stigmas associated with seeking treatment.   Anatomy & Physiology  of the Heart: - Group verbal and written instruction and models provide basic cardiac anatomy and physiology, with the coronary electrical and arterial systems. Review of: AMI, Angina, Valve disease, Heart Failure, Cardiac Arrhythmia, Pacemakers, and the ICD.   Cardiac Procedures: - Group verbal and written instruction and models to describe the testing methods done to diagnose heart disease. Reviews the outcomes of the test results. Describes the treatment choices: Medical Management, Angioplasty, or Coronary Bypass Surgery.   Cardiac Medications: - Group verbal and written instruction to review commonly prescribed medications for heart disease. Reviews the medication, class of the drug, and side effects. Includes the steps to properly store meds and maintain the prescription regimen.   Go Sex-Intimacy & Heart Disease, Get SMART - Goal Setting: - Group verbal and written instruction through game format to discuss heart disease and the return to sexual intimacy. Provides group verbal and written material to discuss and apply goal setting through the application of the S.M.A.R.T. Method.   Other Matters of the Heart: - Provides group verbal, written materials and models to describe Heart Failure, Angina, Valve Disease, and Diabetes in the realm of heart disease. Includes description of the disease process and treatment options available to the cardiac patient.   Exercise &  Equipment Safety: - Individual verbal instruction and demonstration of equipment use and safety with use of the equipment.   Cardiac Rehab from 03/24/2017 in [facility] Cardiac and Pulmonary Rehab  Date  03/24/17  Educator  mc  Instruction Review Code  2- meets goals/outcomes      Infection Prevention: - Provides verbal and written material to individual with discussion of infection control including proper hand washing and proper equipment cleaning during exercise session.   Cardiac Rehab from 03/24/2017 in [facility] Cardiac and  Pulmonary Rehab  Date  03/24/17  Educator  mc  Instruction Review Code  2- meets goals/outcomes      Falls Prevention: - Provides verbal and written material to individual with discussion of falls prevention and safety.   Cardiac Rehab from 03/24/2017 in [facility] Cardiac and Pulmonary Rehab  Date  03/24/17  Educator  mc  Instruction Review Code  2- meets goals/outcomes      Diabetes: - Individual verbal and written instruction to review signs/symptoms of diabetes, desired ranges of glucose level fasting, after meals and with exercise. Advice that pre and post exercise glucose checks will be done for 3 sessions at entry of program.   Cardiac Rehab from 03/24/2017 in [facility] Cardiac and Pulmonary Rehab  Date  03/24/17  Educator  mc  Instruction Review Code  2- meets goals/outcomes       Knowledge Questionnaire Score:     Knowledge Questionnaire Score - 03/24/17 1504      Knowledge Questionnaire Score   Pre Score 26/28  Correct answers reviewed with Wes      Core Components/Risk Factors/Patient Goals at Admission:     Personal Goals and Risk Factors at Admission - 03/24/17 1446      Core Components/Risk Factors/Patient Goals on Admission    Weight Management Weight Loss;Yes   Intervention Weight Management: Develop a combined nutrition and exercise program designed to reach desired caloric intake, while maintaining appropriate intake of nutrient and fiber, sodium and fats, and appropriate energy expenditure required for the weight goal.;Weight Management: Provide education and appropriate resources to help participant work on and attain dietary goals.;Weight Management/Obesity: Establish reasonable short term and long term weight goals.   Admit Weight 196 lb 8 oz (89.1 kg)   Goal Weight: Short Term 193 lb (87.5 kg)   Goal Weight: Long Term 180 lb (81.6 kg)   Hypertension Yes   Intervention Provide education on lifestyle modifcations including regular physical activity/exercise,  weight management, moderate sodium restriction and increased consumption of fresh fruit, vegetables, and low fat dairy, alcohol moderation, and smoking cessation.;Monitor prescription use compliance.   Expected Outcomes Short Term: Continued assessment and intervention until BP is < 140/68mm HG in hypertensive participants. < 130/41mm HG in hypertensive participants with diabetes, heart failure or chronic kidney disease.;Long Term: Maintenance of blood pressure at goal levels.   Lipids Yes   Intervention Provide education and support for participant on nutrition & aerobic/resistive exercise along with prescribed medications to achieve LDL 70mg , HDL >40mg .   Expected Outcomes Short Term: Participant states understanding of desired cholesterol values and is compliant with medications prescribed. Participant is following exercise prescription and nutrition guidelines.;Long Term: Cholesterol controlled with medications as prescribed, with individualized exercise RX and with personalized nutrition plan. Value goals: LDL < 70mg , HDL > 40 mg.      Core Components/Risk Factors/Patient Goals Review:    Core Components/Risk Factors/Patient Goals at Discharge (Final Review):    ITP Comments:     ITP Comments  [place] Name 03/24/17 1441           ITP Comments Med Review completed, initial ITP created. Diagnosis can be found in MD encounter 5/19          Comments: Initial ITP

## 2017-03-24 NOTE — Progress Notes (Signed)
Daily Session Note  Patient Details  Name: Troy Anderson MRN: [id number] Date of Birth: 1961-09-04 Referring Provider:    Encounter Date: 03/24/2017  Check In:     Session Check In - 03/24/17 1439      Check-In   Location [facility]   Staff Present Renita Papa, RN BSN;Jessica Luan Pulling, MA, ACSM RCEP, Exercise Physiologist   Supervising physician immediately available to respond to emergencies See telemetry face sheet for immediately available ER MD   Medication changes reported     No   Fall or balance concerns reported    No   Tobacco Cessation --  Non smoker   Warm-up and Cool-down Performed as group-led instruction   Resistance Training Performed Yes   VAD Patient? No     Pain Assessment   Currently in Pain? No/denies           Exercise Prescription Changes - 03/24/17 1500      Response to Exercise   Blood Pressure (Admit) 128/74   Blood Pressure (Exercise) 140/74   Blood Pressure (Exit) 128/70   Heart Rate (Admit) 69 bpm   Heart Rate (Exercise) 120 bpm   Heart Rate (Exit) 88 bpm   Oxygen Saturation (Admit) 99 %   Oxygen Saturation (Exercise) 98 %   Rating of Perceived Exertion (Exercise) 8      History  Smoking Status  . Never Smoker  Smokeless Tobacco  . Never Used    Goals Met:  Independence with exercise equipment Exercise tolerated well No report of cardiac concerns or symptoms Strength training completed today  Goals Unmet:  Not Applicable  Comments: Med Review Completed    Dr. Emily Filbert is Medical Director for [place] and [name].

## 2017-03-24 NOTE — Patient Instructions (Signed)
Patient Instructions  Patient Details  Name: Troy Anderson MRN: [id number] Date of Birth: 11/14/1960 Referring Provider:  Yolonda Kida, MD  Below are the personal goals you chose as well as exercise and nutrition goals. Our goal is to help you keep on track towards obtaining and maintaining your goals. We will be discussing your progress on these goals with you throughout the program.  Initial Exercise Prescription:     Initial Exercise Prescription - 03/24/17 1500      Date of Initial Exercise RX and Referring Provider   Date 03/24/17   Referring Provider Barrie Lyme MD     Treadmill   MPH 4.2   Grade 2.5   Minutes 15   METs 5.66     Elliptical   Level 2   Speed 5   Minutes 15     T5 Nustep   Level 5   SPM 100   Minutes 15   METs 5     Prescription Details   Frequency (times per week) 3   Duration Progress to 45 minutes of aerobic exercise without signs/symptoms of physical distress     Intensity   THRR 40-80% of Max Heartrate 108-147   Ratings of Perceived Exertion 11-13   Perceived Dyspnea 0-4     Progression   Progression Continue to progress workloads to maintain intensity without signs/symptoms of physical distress.     Resistance Training   Training Prescription Yes   Weight 6 lbs   Reps 10-15      Exercise Goals: Frequency: Be able to perform aerobic exercise three times per week working toward 3-5 days per week.  Intensity: Work with a perceived exertion of 11 (fairly light) - 15 (hard) as tolerated. Follow your new exercise prescription and watch for changes in prescription as you progress with the program. Changes will be reviewed with you when they are made.  Duration: You should be able to do 30 minutes of continuous aerobic exercise in addition to a 5 minute warm-up and a 5 minute cool-down routine.  Nutrition Goals: Your personal nutrition goals will be established when you do your nutrition analysis with the dietician.  The  following are nutrition guidelines to follow: Cholesterol < 200mg /day Sodium < 1500mg /day Fiber: Men over 50 yrs - 30 grams per day  Personal Goals:     Personal Goals and Risk Factors at Admission - 03/24/17 1446      Core Components/Risk Factors/Patient Goals on Admission    Weight Management Weight Loss;Yes   Intervention Weight Management: Develop a combined nutrition and exercise program designed to reach desired caloric intake, while maintaining appropriate intake of nutrient and fiber, sodium and fats, and appropriate energy expenditure required for the weight goal.;Weight Management: Provide education and appropriate resources to help participant work on and attain dietary goals.;Weight Management/Obesity: Establish reasonable short term and long term weight goals.   Admit Weight 196 lb 8 oz (89.1 kg)   Goal Weight: Short Term 193 lb (87.5 kg)   Goal Weight: Long Term 180 lb (81.6 kg)   Hypertension Yes   Intervention Provide education on lifestyle modifcations including regular physical activity/exercise, weight management, moderate sodium restriction and increased consumption of fresh fruit, vegetables, and low fat dairy, alcohol moderation, and smoking cessation.;Monitor prescription use compliance.   Expected Outcomes Short Term: Continued assessment and intervention until BP is < 140/55mm HG in hypertensive participants. < 130/9mm HG in hypertensive participants with diabetes, heart failure or chronic kidney disease.;Long Term: Maintenance of blood  pressure at goal levels.   Lipids Yes   Intervention Provide education and support for participant on nutrition & aerobic/resistive exercise along with prescribed medications to achieve LDL 70mg , HDL >40mg .   Expected Outcomes Short Term: Participant states understanding of desired cholesterol values and is compliant with medications prescribed. Participant is following exercise prescription and nutrition guidelines.;Long Term:  Cholesterol controlled with medications as prescribed, with individualized exercise RX and with personalized nutrition plan. Value goals: LDL < 70mg , HDL > 40 mg.      Tobacco Use Initial Evaluation: History  Smoking Status  . Never Smoker  Smokeless Tobacco  . Never Used    Copy of goals given to participant.

## 2017-03-28 ENCOUNTER — Encounter: Payer: BLUE CROSS/BLUE SHIELD | Attending: Internal Medicine | Admitting: *Deleted

## 2017-03-28 DIAGNOSIS — I213 ST elevation (STEMI) myocardial infarction of unspecified site: Secondary | ICD-10-CM | POA: Diagnosis present

## 2017-03-28 NOTE — Progress Notes (Signed)
Daily Session Note  Patient Details  Name: Troy Anderson MRN: [id number] Date of Birth: 01-30-1961 Referring Provider:     Cardiac Rehab from 03/24/2017 in [facility] Rehab  Referring Provider  Barrie Lyme MD      Encounter Date: 03/28/2017  Check In:     Session Check In - 03/28/17 0848      Check-In   Location [facility]   Staff Present Earlean Shawl, BS, ACSM CEP, Exercise Physiologist;Jessica Luan Pulling, MA, ACSM RCEP, Exercise Physiologist;Carroll Enterkin, RN, BSN   Supervising physician immediately available to respond to emergencies See telemetry face sheet for immediately available ER MD   Medication changes reported     No   Fall or balance concerns reported    No   Warm-up and Cool-down Performed on first and last piece of equipment   Resistance Training Performed Yes   VAD Patient? No     Pain Assessment   Currently in Pain? No/denies   Multiple Pain Sites No         History  Smoking Status  . Never Smoker  Smokeless Tobacco  . Never Used    Goals Met:  Independence with exercise equipment Exercise tolerated well Personal goals reviewed No report of cardiac concerns or symptoms Strength training completed today  Goals Unmet:  Not Applicable  Comments: First full day of exercise!  Patient was oriented to gym and equipment including functions, settings, policies, and procedures.  Patient's individual exercise prescription and treatment plan were reviewed.  All starting workloads were established based on the results of the 6 minute walk test done at initial orientation visit.  The plan for exercise progression was also introduced and progression will be customized based on patient's performance and goals.    Dr. Emily Filbert is Medical Director for [place] and [name].

## 2017-04-01 DIAGNOSIS — I213 ST elevation (STEMI) myocardial infarction of unspecified site: Secondary | ICD-10-CM | POA: Diagnosis not present

## 2017-04-01 NOTE — Progress Notes (Signed)
Daily Session Note  Patient Details  Name: Troy Anderson MRN: [id number] Date of Birth: Jul 13, 1961 Referring Provider:     Cardiac Rehab from 03/24/2017 in [facility] Cardiac and Pulmonary Rehab  Referring Provider  Barrie Lyme MD      Encounter Date: 04/01/2017  Check In:     Session Check In - 04/01/17 0839      Check-In   Location [facility]   Staff Present Gerlene Burdock, RN, BSN;Jessica Luan Pulling, MA, ACSM RCEP, Exercise Physiologist;Aunika Kirsten Oletta Darter, BA, ACSM CEP, Exercise Physiologist   Supervising physician immediately available to respond to emergencies See telemetry face sheet for immediately available ER MD   Medication changes reported     No   Fall or balance concerns reported    No   Warm-up and Cool-down Performed on first and last piece of equipment   Resistance Training Performed Yes   VAD Patient? No     Pain Assessment   Currently in Pain? No/denies         History  Smoking Status  . Never Smoker  Smokeless Tobacco  . Never Used    Goals Met:  Independence with exercise equipment Exercise tolerated well No report of cardiac concerns or symptoms Strength training completed today  Goals Unmet:  Not Applicable  Comments: Pt able to follow exercise prescription today without complaint.  Will continue to monitor for progression.    Dr. Emily Filbert is Medical Director for [name] and [name].

## 2017-04-04 ENCOUNTER — Encounter: Payer: BLUE CROSS/BLUE SHIELD | Admitting: *Deleted

## 2017-04-04 DIAGNOSIS — I213 ST elevation (STEMI) myocardial infarction of unspecified site: Secondary | ICD-10-CM | POA: Diagnosis not present

## 2017-04-04 NOTE — Progress Notes (Unsigned)
Hospital Discharge Notification Diagnosis E78.5 - Hyperlipidemia, unspecified; I10 - Essential (primary) hypertension; I21.3 - ST elevation (STEMI) myocardial infarction of unspecified site

## 2017-04-04 NOTE — Progress Notes (Signed)
Daily Session Note  Patient Details  Name: Amirr Achord MRN: [id number] Date of Birth: [birth year] Referring Provider:     Cardiac Rehab from 03/24/2017 in [facility] Cardiac and Pulmonary Rehab  Referring Provider  Barrie Lyme MD      Encounter Date: 04/04/2017  Check In:     Session Check In - 04/04/17 0852      Check-In   Location [facility]   Staff Present Gerlene Burdock, RN, Moises Blood, BS, ACSM CEP, Exercise Physiologist;Krista Frederico Hamman, RN BSN   Supervising physician immediately available to respond to emergencies See telemetry face sheet for immediately available ER MD   Medication changes reported     No   Fall or balance concerns reported    No   Tobacco Cessation No Change   Warm-up and Cool-down Performed on first and last piece of equipment   Resistance Training Performed Yes   VAD Patient? No     Pain Assessment   Currently in Pain? No/denies   Multiple Pain Sites No         History  Smoking Status  . Never Smoker  Smokeless Tobacco  . Never Used    Goals Met:  Independence with exercise equipment Exercise tolerated well No report of cardiac concerns or symptoms Strength training completed today  Goals Unmet:  Not Applicable  Comments: Pt able to follow exercise prescription today without complaint.  Will continue to monitor for progression.    Dr. Emily Filbert is Medical Director for [place] and [name].

## 2017-04-06 DIAGNOSIS — I213 ST elevation (STEMI) myocardial infarction of unspecified site: Secondary | ICD-10-CM

## 2017-04-06 NOTE — Progress Notes (Signed)
Daily Session Note  Patient Details  Name: Troy Anderson MRN: [id number] Date of Birth: 09/20/1961 Referring Provider:     Cardiac Rehab from 03/24/2017 in [facility] Cardiac and Pulmonary Rehab  Referring Provider  Barrie Lyme MD      Encounter Date: 04/06/2017  Check In:     Session Check In - 04/06/17 0812      Check-In   Location [facility]   Staff Present Nyoka Cowden, RN, BSN, MA;Susanne Bice, RN, BSN, CCRP;Joseph Flavia Shipper   Supervising physician immediately available to respond to emergencies See telemetry face sheet for immediately available ER MD   Medication changes reported     No   Fall or balance concerns reported    No   Tobacco Cessation No Change   Warm-up and Cool-down Performed on first and last piece of equipment   Resistance Training Performed Yes   VAD Patient? No     Pain Assessment   Currently in Pain? No/denies   Multiple Pain Sites No           Exercise Prescription Changes - 04/05/17 1100      Response to Exercise   Blood Pressure (Admit) 112/56   Blood Pressure (Exercise) 140/80   Blood Pressure (Exit) 120/70   Heart Rate (Admit) 67 bpm   Heart Rate (Exercise) 145 bpm   Heart Rate (Exit) 105 bpm   Symptoms none     Progression   Progression Continue to progress workloads to maintain intensity without signs/symptoms of physical distress.   Average METs 5.95     Resistance Training   Training Prescription Yes   Weight 6 lb   Reps 10-15     Interval Training   Interval Training No     Treadmill   MPH 4.2   Grade 2.5   Minutes 15   METs 5.95     Elliptical   Level 2   Speed 5   Minutes 15      History  Smoking Status  . Never Smoker  Smokeless Tobacco  . Never Used    Goals Met:  Independence with exercise equipment Exercise tolerated well No report of cardiac concerns or symptoms Strength training completed today  Goals Unmet:  Not Applicable  Comments: Pt able to follow  exercise prescription today without complaint.  Will continue to monitor for progression.   Dr. Emily Filbert is Medical Director for [place] and [name].

## 2017-04-08 ENCOUNTER — Encounter: Payer: BLUE CROSS/BLUE SHIELD | Admitting: *Deleted

## 2017-04-08 DIAGNOSIS — I213 ST elevation (STEMI) myocardial infarction of unspecified site: Secondary | ICD-10-CM

## 2017-04-08 NOTE — Progress Notes (Signed)
Daily Session Note  Patient Details  Name: Troy Anderson MRN: [id number] Date of Birth: 09/11/1961 Referring Provider:     Cardiac Rehab from 03/24/2017 in [facility] Cardiac and Pulmonary Rehab  Referring Provider  Callwood, Bruce MD      Encounter Date: 04/08/2017  Check In:     Session Check In - 04/08/17 0848      Check-In   Staff Present Mary Jo Abernethy, RN, BSN, MA;Susanne Bice, RN, BSN, CCRP;Amanda Sommer, BA, ACSM CEP, Exercise Physiologist   Supervising physician immediately available to respond to emergencies See telemetry face sheet for immediately available ER MD   Medication changes reported     No   Fall or balance concerns reported    No   Resistance Training Performed Yes   VAD Patient? No     Pain Assessment   Currently in Pain? No/denies         History  Smoking Status  . Never Smoker  Smokeless Tobacco  . Never Used    Goals Met:  Exercise tolerated well No report of cardiac concerns or symptoms Strength training completed today  Goals Unmet:  Not Applicable  Comments: Doing well with exercise prescription progression.    Dr. Mark Miller is Medical Director for [facility] and [name]. 

## 2017-04-11 ENCOUNTER — Encounter: Payer: BLUE CROSS/BLUE SHIELD | Admitting: *Deleted

## 2017-04-11 DIAGNOSIS — I213 ST elevation (STEMI) myocardial infarction of unspecified site: Secondary | ICD-10-CM | POA: Diagnosis not present

## 2017-04-11 NOTE — Progress Notes (Signed)
Daily Session Note  Patient Details  Name: Troy Anderson MRN: [id number] Date of Birth: Feb 05, 1961 Referring Provider:     Cardiac Rehab from 03/24/2017 in [facility] Cardiac and Pulmonary Rehab  Referring Provider  Barrie Lyme MD      Encounter Date: 04/11/2017  Check In:     Session Check In - 04/11/17 0746      Check-In   Location [facility]   Staff Present Gerlene Burdock, RN, Levie Heritage, MA, ACSM RCEP, Exercise Physiologist;Kelly Amedeo Plenty, BS, ACSM CEP, Exercise Physiologist   Supervising physician immediately available to respond to emergencies See telemetry face sheet for immediately available ER MD   Medication changes reported     No   Fall or balance concerns reported    No   Warm-up and Cool-down Performed on first and last piece of equipment   Resistance Training Performed Yes   VAD Patient? No     Pain Assessment   Currently in Pain? No/denies   Multiple Pain Sites No         History  Smoking Status  . Never Smoker  Smokeless Tobacco  . Never Used    Goals Met:  Independence with exercise equipment Exercise tolerated well No report of cardiac concerns or symptoms Strength training completed today  Goals Unmet:  Not Applicable  Comments: Pt able to follow exercise prescription today without complaint.  Will continue to monitor for progression.    Dr. Emily Filbert is Medical Director for [place] and [name].

## 2017-04-13 ENCOUNTER — Encounter: Payer: Self-pay | Admitting: *Deleted

## 2017-04-13 DIAGNOSIS — I213 ST elevation (STEMI) myocardial infarction of unspecified site: Secondary | ICD-10-CM

## 2017-04-13 NOTE — Progress Notes (Signed)
Daily Session Note  Patient Details  Name: Troy Anderson MRN: [id number] Date of Birth: 1961/08/18 Referring Provider:     Cardiac Rehab from 03/24/2017 in [facility] Cardiac and Pulmonary Rehab  Referring Provider  Barrie Lyme MD      Encounter Date: 04/13/2017  Check In:     Session Check In - 04/13/17 0802      Check-In   Location [facility]   Staff Present Alberteen Sam, MA, ACSM RCEP, Exercise Physiologist;Krista Frederico Hamman, RN BSN;Rielly Corlett Flavia Shipper   Supervising physician immediately available to respond to emergencies See telemetry face sheet for immediately available ER MD   Medication changes reported     No   Fall or balance concerns reported    No   Tobacco Cessation No Change   Warm-up and Cool-down Performed on first and last piece of equipment   Resistance Training Performed Yes   VAD Patient? No     Pain Assessment   Currently in Pain? No/denies   Multiple Pain Sites No         History  Smoking Status  . Never Smoker  Smokeless Tobacco  . Never Used    Goals Met:  Independence with exercise equipment Exercise tolerated well No report of cardiac concerns or symptoms Strength training completed today  Goals Unmet:  Not Applicable  Comments: Pt able to follow exercise prescription today without complaint.  Will continue to monitor for progression.  Reviewed home exercise with pt today.  Pt plans to walk and ride bike at home for exercise.  He is going to have to make time to exercise despite his work schedule.  Reviewed THR, pulse, RPE, sign and symptoms, NTG use, and when to call 911 or MD.  Also discussed weather considerations and indoor options.  Pt voiced understanding.   Dr. Emily Filbert is Medical Director for [place] and [name].

## 2017-04-13 NOTE — Progress Notes (Signed)
Cardiac Individual Treatment Plan  Patient Details  Name: Troy Anderson MRN: [id number] Date of Birth: 1961-06-17 Referring Provider:     Cardiac Rehab from 03/24/2017 in [facility] Cardiac and Pulmonary Rehab  Referring Provider  Barrie Lyme MD      Initial Encounter Date:    Cardiac Rehab from 03/24/2017 in [facility] Cardiac and Pulmonary Rehab  Date  03/24/17  Referring Provider  Barrie Lyme MD      Visit Diagnosis: ST elevation myocardial infarction (STEMI), unspecified artery ([place])  Patient's Home Medications on Admission:  Current Outpatient Prescriptions:  .  aspirin 81 MG chewable tablet, Chew 1 tablet (81 mg total) by mouth daily., Disp: , Rfl:  .  atorvastatin (LIPITOR) 80 MG tablet, Take 1 tablet (80 mg total) by mouth daily at 6 PM., Disp: 30 tablet, Rfl: 0 .  clopidogrel (PLAVIX) 75 MG tablet, Take 1 tablet (75 mg total) by mouth daily with breakfast., Disp: 30 tablet, Rfl: 0 .  lisinopril (PRINIVIL,ZESTRIL) 10 MG tablet, Take 1 tablet (10 mg total) by mouth daily., Disp: 30 tablet, Rfl: 0 .  metoprolol succinate (TOPROL-XL) 25 MG 24 hr tablet, Take 1 tablet (25 mg total) by mouth daily., Disp: 60 tablet, Rfl: 0 .  nitroGLYCERIN (NITROSTAT) 0.4 MG SL tablet, Place 1 tablet (0.4 mg total) under the tongue every 5 (five) minutes as needed for chest pain., Disp: 30 tablet, Rfl: 0  Past Medical History: Past Medical History:  Diagnosis Date  . Hyperlipidemia   . Hypertension     Tobacco Use: History  Smoking Status  . Never Smoker  Smokeless Tobacco  . Never Used    Labs: Recent Review Flowsheet Data    Labs for ITP Cardiac and Pulmonary Rehab Latest Ref Rng & Units 09/14/2014 08/09/2015 11/19/2016 02/10/2017   Cholestrol 0 - 200 mg/dL 225(H) 245(H) 286(H) 161   LDLCALC 0 - 99 mg/dL 139(H) 153(H) 170(H) 57   HDL >40 mg/dL 46 51 47 42   Trlycerides <150 mg/dL 199(H) 207(H) 346(H) 312(H)   Hemoglobin A1c 4.8 - 5.6 % - - - 6.0(H)       Exercise Target Goals:     Exercise Program Goal: Individual exercise prescription set with THRR, safety & activity barriers. Participant demonstrates ability to understand and report RPE using BORG scale, to self-measure pulse accurately, and to acknowledge the importance of the exercise prescription.  Exercise Prescription Goal: Starting with aerobic activity 30 plus minutes a day, 3 days per week for initial exercise prescription. Provide home exercise prescription and guidelines that participant acknowledges understanding prior to discharge.  Activity Barriers & Risk Stratification:     Activity Barriers & Cardiac Risk Stratification - 03/24/17 1508      Activity Barriers & Cardiac Risk Stratification   Activity Barriers None   Cardiac Risk Stratification Moderate      6 Minute Walk:     6 Minute Walk    Row Name 03/24/17 1518         6 Minute Walk   Phase Initial     Distance 2230 feet     Walk Time 6 minutes     # of Rest Breaks 0     MPH 4.22     METS 5.65     RPE 8     VO2 Peak 19.76     Symptoms No     Resting HR 69 bpm     Resting BP 128/74     Max Ex. HR 120 bpm  Max Ex. BP 140/74     2 Minute Post BP 128/70        Oxygen Initial Assessment:   Oxygen Re-Evaluation:   Oxygen Discharge (Final Oxygen Re-Evaluation):   Initial Exercise Prescription:     Initial Exercise Prescription - 03/24/17 1500      Date of Initial Exercise RX and Referring Provider   Date 03/24/17   Referring Provider Barrie Lyme MD     Treadmill   MPH 4.2   Grade 2.5   Minutes 15   METs 5.66     Elliptical   Level 2   Speed 5   Minutes 15     T5 Nustep   Level 5   SPM 100   Minutes 15   METs 5     Prescription Details   Frequency (times per week) 3   Duration Progress to 45 minutes of aerobic exercise without signs/symptoms of physical distress     Intensity   THRR 40-80% of Max Heartrate 108-147   Ratings of Perceived Exertion 11-13   Perceived Dyspnea 0-4      Progression   Progression Continue to progress workloads to maintain intensity without signs/symptoms of physical distress.     Resistance Training   Training Prescription Yes   Weight 6 lbs   Reps 10-15      Perform Capillary Blood Glucose checks as needed.  Exercise Prescription Changes:     Exercise Prescription Changes    Row Name 03/24/17 1500 04/05/17 1100           Response to Exercise   Blood Pressure (Admit) 128/74 112/56      Blood Pressure (Exercise) 140/74 140/80      Blood Pressure (Exit) 128/70 120/70      Heart Rate (Admit) 69 bpm 67 bpm      Heart Rate (Exercise) 120 bpm 145 bpm      Heart Rate (Exit) 88 bpm 105 bpm      Oxygen Saturation (Admit) 99 %  -      Oxygen Saturation (Exercise) 98 %  -      Rating of Perceived Exertion (Exercise) 8  -      Symptoms none none      Comments walk test results  -        Progression   Progression  - Continue to progress workloads to maintain intensity without signs/symptoms of physical distress.      Average METs  - 5.95        Resistance Training   Training Prescription  - Yes      Weight  - 6 lb      Reps  - 10-15        Interval Training   Interval Training  - No        Treadmill   MPH  - 4.2      Grade  - 2.5      Minutes  - 15      METs  - 5.95        Elliptical   Level  - 2      Speed  - 5      Minutes  - 15         Exercise Comments:     Exercise Comments    Row Name 03/28/17 0849           Exercise Comments First full day of exercise!  Patient was oriented to gym and equipment including functions,  settings, policies, and procedures.  Patient's individual exercise prescription and treatment plan were reviewed.  All starting workloads were established based on the results of the 6 minute walk test done at initial orientation visit.  The plan for exercise progression was also introduced and progression will be customized based on patient's performance and goals          Exercise Goals  and Review:     Exercise Goals    Row Name 03/24/17 1520             Exercise Goals   Increase Physical Activity Yes       Intervention Provide advice, education, support and counseling about physical activity/exercise needs.;Develop an individualized exercise prescription for aerobic and resistive training based on initial evaluation findings, risk stratification, comorbidities and participant's personal goals.       Expected Outcomes Achievement of increased cardiorespiratory fitness and enhanced flexibility, muscular endurance and strength shown through measurements of functional capacity and personal statement of participant.       Increase Strength and Stamina Yes       Intervention Provide advice, education, support and counseling about physical activity/exercise needs.;Develop an individualized exercise prescription for aerobic and resistive training based on initial evaluation findings, risk stratification, comorbidities and participant's personal goals.       Expected Outcomes Achievement of increased cardiorespiratory fitness and enhanced flexibility, muscular endurance and strength shown through measurements of functional capacity and personal statement of participant.          Exercise Goals Re-Evaluation :     Exercise Goals Re-Evaluation    Row Name 04/05/17 1110             Exercise Goal Re-Evaluation   Exercise Goals Review Increase Physical Activity;Increase Strenth and Stamina       Comments Wes has tolerated exercise well in his first sessions.       Expected Outcomes Short - Wes will attend HT regularly.  Long - Wes will feel comfortable exreciseing independently.          Discharge Exercise Prescription (Final Exercise Prescription Changes):     Exercise Prescription Changes - 04/05/17 1100      Response to Exercise   Blood Pressure (Admit) 112/56   Blood Pressure (Exercise) 140/80   Blood Pressure (Exit) 120/70   Heart Rate (Admit) 67 bpm   Heart  Rate (Exercise) 145 bpm   Heart Rate (Exit) 105 bpm   Symptoms none     Progression   Progression Continue to progress workloads to maintain intensity without signs/symptoms of physical distress.   Average METs 5.95     Resistance Training   Training Prescription Yes   Weight 6 lb   Reps 10-15     Interval Training   Interval Training No     Treadmill   MPH 4.2   Grade 2.5   Minutes 15   METs 5.95     Elliptical   Level 2   Speed 5   Minutes 15      Nutrition:  Target Goals: Understanding of nutrition guidelines, daily intake of sodium 1500mg , cholesterol 200mg , calories 30% from fat and 7% or less from saturated fats, daily to have 5 or more servings of fruits and vegetables.  Biometrics:     Pre Biometrics - 03/24/17 1520      Pre Biometrics   Height 5' 9.6" (1.768 m)   Weight 196 lb 8 oz (89.1 kg)   Waist Circumference 37.75 inches  Hip Circumference 38.25 inches   Waist to Hip Ratio 0.99 %   BMI (Calculated) 28.6   Single Leg Stand 30 seconds       Nutrition Therapy Plan and Nutrition Goals:   Nutrition Discharge: Rate Your Plate Scores:     Nutrition Assessments - 03/24/17 1446      MEDFICTS Scores   Pre Score 6      Nutrition Goals Re-Evaluation:   Nutrition Goals Discharge (Final Nutrition Goals Re-Evaluation):   Psychosocial: Target Goals: Acknowledge presence or absence of significant depression and/or stress, maximize coping skills, provide positive support system. Participant is able to verbalize types and ability to use techniques and skills needed for reducing stress and depression.   Initial Review & Psychosocial Screening:     Initial Psych Review & Screening - 03/24/17 1459      Initial Review   Current issues with None Identified     Family Dynamics   Good Support System? Yes  Wife and children      Barriers   Psychosocial barriers to participate in program There are no identifiable barriers or psychosocial needs.      Screening Interventions   Interventions Encouraged to exercise;Program counselor consult      Quality of Life Scores:      Quality of Life - 03/24/17 1500      Quality of Life Scores   Health/Function Pre 20.73 %   Socioeconomic Pre 20.29 %   Psych/Spiritual Pre 21 %   Family Pre 21 %   GLOBAL Pre 20.74 %      PHQ-9: Recent Review Flowsheet Data    Depression screen [facility] 2/9 03/24/2017 11/19/2016 08/09/2015   Decreased Interest 0 0 0   Down, Depressed, Hopeless 0 0 0   PHQ - 2 Score 0 0 0   Altered sleeping 0 - -   Tired, decreased energy 0 - -   Change in appetite 0 - -   Feeling bad or failure about yourself  0 - -   Trouble concentrating 0 - -   Moving slowly or fidgety/restless 0 - -   Suicidal thoughts 0 - -   PHQ-9 Score 0 - -   Difficult doing work/chores Not difficult at all - -     Interpretation of Total Score  Total Score Depression Severity:  1-4 = Minimal depression, 5-9 = Mild depression, 10-14 = Moderate depression, 15-19 = Moderately severe depression, 20-27 = Severe depression   Psychosocial Evaluation and Intervention:   Psychosocial Re-Evaluation:   Psychosocial Discharge (Final Psychosocial Re-Evaluation):   Vocational Rehabilitation: Provide vocational rehab assistance to qualifying candidates.   Vocational Rehab Evaluation & Intervention:     Vocational Rehab - 03/24/17 1506      Initial Vocational Rehab Evaluation & Intervention   Assessment shows need for Vocational Rehabilitation No      Education: Education Goals: Education classes will be provided on a weekly basis, covering required topics. Participant will state understanding/return demonstration of topics presented.  Learning Barriers/Preferences:     Learning Barriers/Preferences - 03/24/17 1504      Learning Barriers/Preferences   Learning Barriers None   Learning Preferences None      Education Topics: General Nutrition Guidelines/Fats and Fiber: -Group  instruction provided by verbal, written material, models and posters to present the general guidelines for heart healthy nutrition. Gives an explanation and review of dietary fats and fiber.   Controlling Sodium/Reading Food Labels: -Group verbal and written material supporting the discussion of  sodium use in heart healthy nutrition. Review and explanation with models, verbal and written materials for utilization of the food label.   Cardiac Rehab from 04/11/2017 in [facility] Cardiac and Pulmonary Rehab  Date  03/28/17  Educator  CR  Instruction Review Code  2- meets goals/outcomes      Exercise Physiology & Risk Factors: - Group verbal and written instruction with models to review the exercise physiology of the cardiovascular system and associated critical values. Details cardiovascular disease risk factors and the goals associated with each risk factor.   Cardiac Rehab from 04/11/2017 in [facility] Cardiac and Pulmonary Rehab  Date  04/04/17  Educator  [facility]  Instruction Review Code  2- meets goals/outcomes      Aerobic Exercise & Resistance Training: - Gives group verbal and written discussion on the health impact of inactivity. On the components of aerobic and resistive training programs and the benefits of this training and how to safely progress through these programs.   Cardiac Rehab from 04/11/2017 in [facility] Cardiac and Pulmonary Rehab  Date  04/06/17  Educator  SB  Instruction Review Code  2- meets goals/outcomes      Flexibility, Balance, General Exercise Guidelines: - Provides group verbal and written instruction on the benefits of flexibility and balance training programs. Provides general exercise guidelines with specific guidelines to those with heart or lung disease. Demonstration and skill practice provided.   Cardiac Rehab from 04/11/2017 in [facility] Cardiac and Pulmonary Rehab  Date  04/11/17  Educator  [facility]  Instruction Review Code  2- meets goals/outcomes      Stress Management: -  Provides group verbal and written instruction about the health risks of elevated stress, cause of high stress, and healthy ways to reduce stress.   Depression: - Provides group verbal and written instruction on the correlation between heart/lung disease and depressed mood, treatment options, and the stigmas associated with seeking treatment.   Anatomy & Physiology of the Heart: - Group verbal and written instruction and models provide basic cardiac anatomy and physiology, with the coronary electrical and arterial systems. Review of: AMI, Angina, Valve disease, Heart Failure, Cardiac Arrhythmia, Pacemakers, and the ICD.   Cardiac Procedures: - Group verbal and written instruction and models to describe the testing methods done to diagnose heart disease. Reviews the outcomes of the test results. Describes the treatment choices: Medical Management, Angioplasty, or Coronary Bypass Surgery.   Cardiac Medications: - Group verbal and written instruction to review commonly prescribed medications for heart disease. Reviews the medication, class of the drug, and side effects. Includes the steps to properly store meds and maintain the prescription regimen.   Go Sex-Intimacy & Heart Disease, Get SMART - Goal Setting: - Group verbal and written instruction through game format to discuss heart disease and the return to sexual intimacy. Provides group verbal and written material to discuss and apply goal setting through the application of the S.M.A.R.T. Method.   Other Matters of the Heart: - Provides group verbal, written materials and models to describe Heart Failure, Angina, Valve Disease, and Diabetes in the realm of heart disease. Includes description of the disease process and treatment options available to the cardiac patient.   Exercise & Equipment Safety: - Individual verbal instruction and demonstration of equipment use and safety with use of the equipment.   Cardiac Rehab from 04/11/2017 in  [facility] Cardiac and Pulmonary Rehab  Date  03/24/17  Educator  mc  Instruction Review Code  2- meets goals/outcomes  Infection Prevention: - Provides verbal and written material to individual with discussion of infection control including proper hand washing and proper equipment cleaning during exercise session.   Cardiac Rehab from 04/11/2017 in [facility] Cardiac and Pulmonary Rehab  Date  03/24/17  Educator  mc  Instruction Review Code  2- meets goals/outcomes      Falls Prevention: - Provides verbal and written material to individual with discussion of falls prevention and safety.   Cardiac Rehab from 04/11/2017 in [facility] Cardiac and Pulmonary Rehab  Date  03/24/17  Educator  mc  Instruction Review Code  2- meets goals/outcomes      Diabetes: - Individual verbal and written instruction to review signs/symptoms of diabetes, desired ranges of glucose level fasting, after meals and with exercise. Advice that pre and post exercise glucose checks will be done for 3 sessions at entry of program.   Cardiac Rehab from 04/11/2017 in [facility] Cardiac and Pulmonary Rehab  Date  03/24/17  Educator  mc  Instruction Review Code  2- meets goals/outcomes       Knowledge Questionnaire Score:     Knowledge Questionnaire Score - 03/24/17 1504      Knowledge Questionnaire Score   Pre Score 26/28  Correct answers reviewed with Wes      Core Components/Risk Factors/Patient Goals at Admission:     Personal Goals and Risk Factors at Admission - 03/24/17 1446      Core Components/Risk Factors/Patient Goals on Admission    Weight Management Weight Loss;Yes   Intervention Weight Management: Develop a combined nutrition and exercise program designed to reach desired caloric intake, while maintaining appropriate intake of nutrient and fiber, sodium and fats, and appropriate energy expenditure required for the weight goal.;Weight Management: Provide education and appropriate resources to help  participant work on and attain dietary goals.;Weight Management/Obesity: Establish reasonable short term and long term weight goals.   Admit Weight 196 lb 8 oz (89.1 kg)   Goal Weight: Short Term 193 lb (87.5 kg)   Goal Weight: Long Term 180 lb (81.6 kg)   Hypertension Yes   Intervention Provide education on lifestyle modifcations including regular physical activity/exercise, weight management, moderate sodium restriction and increased consumption of fresh fruit, vegetables, and low fat dairy, alcohol moderation, and smoking cessation.;Monitor prescription use compliance.   Expected Outcomes Short Term: Continued assessment and intervention until BP is < 140/79mm HG in hypertensive participants. < 130/60mm HG in hypertensive participants with diabetes, heart failure or chronic kidney disease.;Long Term: Maintenance of blood pressure at goal levels.   Lipids Yes   Intervention Provide education and support for participant on nutrition & aerobic/resistive exercise along with prescribed medications to achieve LDL 70mg , HDL >40mg .   Expected Outcomes Short Term: Participant states understanding of desired cholesterol values and is compliant with medications prescribed. Participant is following exercise prescription and nutrition guidelines.;Long Term: Cholesterol controlled with medications as prescribed, with individualized exercise RX and with personalized nutrition plan. Value goals: LDL < 70mg , HDL > 40 mg.      Core Components/Risk Factors/Patient Goals Review:    Core Components/Risk Factors/Patient Goals at Discharge (Final Review):    ITP Comments:     ITP Comments    Row Name 03/24/17 1441 04/13/17 0627         ITP Comments Med Review completed, initial ITP created. Diagnosis can be found in MD encounter 5/19 30 day review. Continue with ITP unless directed changes per Medical Director review  Comments:

## 2017-04-15 VITALS — Ht 69.6 in | Wt 193.0 lb

## 2017-04-15 DIAGNOSIS — I213 ST elevation (STEMI) myocardial infarction of unspecified site: Secondary | ICD-10-CM | POA: Diagnosis not present

## 2017-04-15 NOTE — Progress Notes (Signed)
Daily Session Note  Patient Details  Name: Blair Lundeen MRN: [id number] Date of Birth: 1961/06/19 Referring Provider:     Cardiac Rehab from 03/24/2017 in [facility] Cardiac and Pulmonary Rehab  Referring Provider  Barrie Lyme MD      Encounter Date: 04/15/2017  Check In:     Session Check In - 04/15/17 0849      Check-In   Location [facility]-Cardiac & Pulmonary Rehab   Staff Present Alberteen Sam, MA, ACSM RCEP, Exercise Physiologist;Amanda Oletta Darter, BA, ACSM CEP, Exercise Physiologist;Diane [facility] RN,BSN   Supervising physician immediately available to respond to emergencies See telemetry face sheet for immediately available ER MD   Medication changes reported     No   Fall or balance concerns reported    No   Warm-up and Cool-down Performed on first and last piece of equipment   Resistance Training Performed Yes   VAD Patient? No     Pain Assessment   Currently in Pain? No/denies         History  Smoking Status  . Never Smoker  Smokeless Tobacco  . Never Used    Goals Met:  Independence with exercise equipment Exercise tolerated well No report of cardiac concerns or symptoms Strength training completed today  Goals Unmet:  Not Applicable  Comments: Pt able to follow exercise prescription today without complaint.  Will continue to monitor for progression.  Emery graduated today from cardiac rehab with 13 sessions completed.  Details of the patient's exercise prescription and what He needs to do in order to continue the prescription and progress were discussed with patient.  Patient was given a copy of prescription and goals.  Patient verbalized understanding.  Adithya plans to continue to exercise by walking and biking at home and on the road.    Dr. Emily Filbert is Medical Director for [place] and [name].

## 2017-04-19 NOTE — Progress Notes (Signed)
Discharge Summary  Patient Details  Name: Troy Anderson MRN: [id number] Date of Birth: May 05, 1961 Referring Provider:     Cardiac Rehab from 03/24/2017 in [facility] Cardiac [facility]  Referring Provider  Barrie Lyme MD       Number of Visits: 13/36  Reason for Discharge:  Patient reached a stable level of exercise. Patient independent in their exercise. Early Exit:  Back to work  Smoking History:  History  Smoking Status  . Never Smoker  Smokeless Tobacco  . Never Used    Diagnosis:  ST elevation myocardial infarction (STEMI), unspecified artery ([place])  ADL [facility]:   Initial Exercise Prescription:     Initial Exercise Prescription - 03/24/17 1500      Date of Initial Exercise RX and Referring Provider   Date 03/24/17   Referring Provider Barrie Lyme MD     Treadmill   MPH 4.2   Grade 2.5   Minutes 15   METs 5.66     Elliptical   Level 2   Speed 5   Minutes 15     T5 Nustep   Level 5   SPM 100   Minutes 15   METs 5     Prescription Details   Frequency (times per week) 3   Duration Progress to 45 minutes of aerobic exercise without signs/symptoms of physical distress     Intensity   THRR 40-80% of Max Heartrate 108-147   Ratings of Perceived Exertion 11-13   Perceived Dyspnea 0-4     Progression   Progression Continue to progress workloads to maintain intensity without signs/symptoms of physical distress.     Resistance Training   Training Prescription Yes   Weight 6 lbs   Reps 10-15      Discharge Exercise Prescription (Final Exercise Prescription Changes):     Exercise Prescription Changes - 04/13/17 0800      Home Exercise Plan   Plans to continue exercise at Home (comment)  walk and bike   Frequency Add 3 additional days to program exercise sessions.   Initial Home Exercises Provided 04/13/17      Functional Capacity:     6 Minute Walk    Row Name 03/24/17 1518 04/15/17 0931       6 Minute Walk   Phase  Initial Discharge    Distance 2230 feet 2355 feet    Distance % Change  - 5.6 %  125 ft    Walk Time 6 minutes 6 minutes    # of Rest Breaks 0 0    MPH 4.22 4.46    METS 5.65 5.99    RPE 8 13    VO2 Peak 19.76 20.9    Symptoms No No    Resting HR 69 bpm 89 bpm    Resting BP 128/74 112/64    Max Ex. HR 120 bpm 116 bpm    Max Ex. BP 140/74 156/70    2 Minute Post BP 128/70  -       Psychological, QOL, Others - Outcomes: PHQ 2/9: Depression screen [facility] 2/9 04/15/2017 03/24/2017 11/19/2016 08/09/2015  Decreased Interest 0 0 0 0  Down, Depressed, Hopeless 0 0 0 0  PHQ - 2 Score 0 0 0 0  Altered sleeping 0 0 - -  Tired, decreased energy 0 0 - -  Change in appetite 0 0 - -  Feeling bad or failure about yourself  0 0 - -  Trouble concentrating 0 0 - -  Moving  slowly or fidgety/restless 0 0 - -  Suicidal thoughts 0 0 - -  PHQ-9 Score 0 0 - -  Difficult doing work/chores Not difficult at all Not difficult at all - -    Quality of Life:     Quality of Life - 04/15/17 0950      Quality of Life Scores   Health/Function Pre 20.73 %   Health/Function Post 27.63 %   Health/Function % Change 33.29 %   Socioeconomic Pre 20.29 %   Socioeconomic Post 26.07 %   Socioeconomic % Change  28.49 %   Psych/Spiritual Pre 21 %   Psych/Spiritual Post 28.07 %   Psych/Spiritual % Change 33.67 %   Family Pre 21 %   Family Post 28.8 %   Family % Change 37.14 %   GLOBAL Pre 20.74 %   GLOBAL Post 27.57 %   GLOBAL % Change 32.93 %      Personal Goals: Goals established at orientation with interventions provided to work toward goal.     Personal Goals and Risk Factors at Admission - 03/24/17 1446      Core Components/Risk Factors/Patient Goals on Admission    Weight Management Weight Loss;Yes   Intervention Weight Management: Develop a combined nutrition and exercise program designed to reach desired caloric intake, while maintaining appropriate intake of nutrient and fiber, sodium and  fats, and appropriate energy expenditure required for the weight goal.;Weight Management: Provide education and appropriate resources to help participant work on and attain dietary goals.;Weight Management/Obesity: Establish reasonable short term and long term weight goals.   Admit Weight 196 lb 8 oz (89.1 kg)   Goal Weight: Short Term 193 lb (87.5 kg)   Goal Weight: Long Term 180 lb (81.6 kg)   Hypertension Yes   Intervention Provide education on lifestyle modifcations including regular physical activity/exercise, weight management, moderate sodium restriction and increased consumption of fresh fruit, vegetables, and low fat dairy, alcohol moderation, and smoking cessation.;Monitor prescription use compliance.   Expected Outcomes Short Term: Continued assessment and intervention until BP is < 140/54mm HG in hypertensive participants. < 130/61mm HG in hypertensive participants with diabetes, heart failure or chronic kidney disease.;Long Term: Maintenance of blood pressure at goal levels.   Lipids Yes   Intervention Provide education and support for participant on nutrition & aerobic/resistive exercise along with prescribed medications to achieve LDL 70mg , HDL >40mg .   Expected Outcomes Short Term: Participant states understanding of desired cholesterol values and is compliant with medications prescribed. Participant is following exercise prescription and nutrition guidelines.;Long Term: Cholesterol controlled with medications as prescribed, with individualized exercise RX and with personalized nutrition plan. Value goals: LDL < 70mg , HDL > 40 mg.       Personal Goals Discharge:   Nutrition & Weight - Outcomes:     Pre Biometrics - 03/24/17 1520      Pre Biometrics   Height 5' 9.6" (1.768 m)   Weight 196 lb 8 oz (89.1 kg)   Waist Circumference 37.75 inches   Hip Circumference 38.25 inches   Waist to Hip Ratio 0.99 %   BMI (Calculated) 28.6   Single Leg Stand 30 seconds         Post  Biometrics - 04/15/17 0938       Post  Biometrics   Height 5' 9.6" (1.768 m)   Weight 193 lb (87.5 kg)   Waist Circumference 37.25 inches   Hip Circumference 38.25 inches   Waist to Hip Ratio 0.97 %  BMI (Calculated) 28.1   Single Leg Stand 30 seconds      Nutrition:   Nutrition Discharge:     Nutrition Assessments - 04/15/17 0950      MEDFICTS Scores   Pre Score 6   Post Score 0   Score Difference -6      Education Questionnaire Score:     Knowledge Questionnaire Score - 04/15/17 0950      Knowledge Questionnaire Score   Pre Score 26/28   Post Score 28/28      Goals reviewed with patient; copy given to patient.

## 2017-04-19 NOTE — Progress Notes (Signed)
Cardiac Individual Treatment Plan  Patient Details  Name: Troy Anderson MRN: [id number] Date of Birth: January 16, 1961 Referring Provider:     Cardiac Rehab from 03/24/2017 in [facility] Cardiac and Pulmonary Rehab  Referring Provider  Barrie Lyme MD      Initial Encounter Date:    Cardiac Rehab from 03/24/2017 in [facility] Cardiac and Pulmonary Rehab  Date  03/24/17  Referring Provider  Barrie Lyme MD      Visit Diagnosis: ST elevation myocardial infarction (STEMI), unspecified artery ([place])  Patient's Home Medications on Admission:  Current Outpatient Prescriptions:  .  aspirin 81 MG chewable tablet, Chew 1 tablet (81 mg total) by mouth daily., Disp: , Rfl:  .  atorvastatin (LIPITOR) 80 MG tablet, Take 1 tablet (80 mg total) by mouth daily at 6 PM., Disp: 30 tablet, Rfl: 0 .  clopidogrel (PLAVIX) 75 MG tablet, Take 1 tablet (75 mg total) by mouth daily with breakfast., Disp: 30 tablet, Rfl: 0 .  lisinopril (PRINIVIL,ZESTRIL) 10 MG tablet, Take 1 tablet (10 mg total) by mouth daily., Disp: 30 tablet, Rfl: 0 .  metoprolol succinate (TOPROL-XL) 25 MG 24 hr tablet, Take 1 tablet (25 mg total) by mouth daily., Disp: 60 tablet, Rfl: 0 .  nitroGLYCERIN (NITROSTAT) 0.4 MG SL tablet, Place 1 tablet (0.4 mg total) under the tongue every 5 (five) minutes as needed for chest pain., Disp: 30 tablet, Rfl: 0  Past Medical History: Past Medical History:  Diagnosis Date  . Hyperlipidemia   . Hypertension     Tobacco Use: History  Smoking Status  . Never Smoker  Smokeless Tobacco  . Never Used    Labs: Recent Review Flowsheet Data    Labs for ITP Cardiac and Pulmonary Rehab Latest Ref Rng & Units 09/14/2014 08/09/2015 11/19/2016 02/10/2017   Cholestrol 0 - 200 mg/dL 225(H) 245(H) 286(H) 161   LDLCALC 0 - 99 mg/dL 139(H) 153(H) 170(H) 57   HDL >40 mg/dL 46 51 47 42   Trlycerides <150 mg/dL 199(H) 207(H) 346(H) 312(H)   Hemoglobin A1c 4.8 - 5.6 % - - - 6.0(H)       Exercise Target Goals:     Exercise Program Goal: Individual exercise prescription set with THRR, safety & activity barriers. Participant demonstrates ability to understand and report RPE using BORG scale, to self-measure pulse accurately, and to acknowledge the importance of the exercise prescription.  Exercise Prescription Goal: Starting with aerobic activity 30 plus minutes a day, 3 days per week for initial exercise prescription. Provide home exercise prescription and guidelines that participant acknowledges understanding prior to discharge.  Activity Barriers & Risk Stratification:     Activity Barriers & Cardiac Risk Stratification - 03/24/17 1508      Activity Barriers & Cardiac Risk Stratification   Activity Barriers None   Cardiac Risk Stratification Moderate      6 Minute Walk:     6 Minute Walk    Row Name 03/24/17 1518 04/15/17 0931       6 Minute Walk   Phase Initial Discharge    Distance 2230 feet 2355 feet    Distance % Change  - 5.6 %  125 ft    Walk Time 6 minutes 6 minutes    # of Rest Breaks 0 0    MPH 4.22 4.46    METS 5.65 5.99    RPE 8 13    VO2 Peak 19.76 20.9    Symptoms No No    Resting HR 69 bpm 89 bpm  Resting BP 128/74 112/64    Max Ex. HR 120 bpm 116 bpm    Max Ex. BP 140/74 156/70    2 Minute Post BP 128/70  -       Oxygen Initial Assessment:   Oxygen Re-Evaluation:   Oxygen Discharge (Final Oxygen Re-Evaluation):   Initial Exercise Prescription:     Initial Exercise Prescription - 03/24/17 1500      Date of Initial Exercise RX and Referring Provider   Date 03/24/17   Referring Provider Barrie Lyme MD     Treadmill   MPH 4.2   Grade 2.5   Minutes 15   METs 5.66     Elliptical   Level 2   Speed 5   Minutes 15     T5 Nustep   Level 5   SPM 100   Minutes 15   METs 5     Prescription Details   Frequency (times per week) 3   Duration Progress to 45 minutes of aerobic exercise without signs/symptoms of physical distress      Intensity   THRR 40-80% of Max Heartrate 108-147   Ratings of Perceived Exertion 11-13   Perceived Dyspnea 0-4     Progression   Progression Continue to progress workloads to maintain intensity without signs/symptoms of physical distress.     Resistance Training   Training Prescription Yes   Weight 6 lbs   Reps 10-15      Perform Capillary Blood Glucose checks as needed.  Exercise Prescription Changes:     Exercise Prescription Changes    Row Name 03/24/17 1500 04/05/17 1100 04/13/17 0800         Response to Exercise   Blood Pressure (Admit) 128/74 112/56  -     Blood Pressure (Exercise) 140/74 140/80  -     Blood Pressure (Exit) 128/70 120/70  -     Heart Rate (Admit) 69 bpm 67 bpm  -     Heart Rate (Exercise) 120 bpm 145 bpm  -     Heart Rate (Exit) 88 bpm 105 bpm  -     Oxygen Saturation (Admit) 99 %  -  -     Oxygen Saturation (Exercise) 98 %  -  -     Rating of Perceived Exertion (Exercise) 8  -  -     Symptoms none none  -     Comments walk test results  -  -       Progression   Progression  - Continue to progress workloads to maintain intensity without signs/symptoms of physical distress.  -     Average METs  - 5.95  -       Resistance Training   Training Prescription  - Yes  -     Weight  - 6 lb  -     Reps  - 10-15  -       Interval Training   Interval Training  - No  -       Treadmill   MPH  - 4.2  -     Grade  - 2.5  -     Minutes  - 15  -     METs  - 5.95  -       Elliptical   Level  - 2  -     Speed  - 5  -     Minutes  - 15  -       Home  Exercise Plan   Plans to continue exercise at  -  - Home (comment)  walk and bike     Frequency  -  - Add 3 additional days to program exercise sessions.     Initial Home Exercises Provided  -  - 04/13/17        Exercise Comments:     Exercise Comments    Row Name 03/28/17 0849 04/15/17 1015         Exercise Comments First full day of exercise!  Patient was oriented to gym and equipment  including functions, settings, policies, and procedures.  Patient's individual exercise prescription and treatment plan were reviewed.  All starting workloads were established based on the results of the 6 minute walk test done at initial orientation visit.  The plan for exercise progression was also introduced and progression will be customized based on patient's performance and goals  Prabhav graduated today from cardiac rehab with 13 sessions completed.  Details of the patient's exercise prescription and what He needs to do in order to continue the prescription and progress were discussed with patient.  Patient was given a copy of prescription and goals.  Patient verbalized understanding.  Marissa plans to continue to exercise by walking and biking at home and on the road.         Exercise Goals and Review:     Exercise Goals    Row Name 03/24/17 1520             Exercise Goals   Increase Physical Activity Yes       Intervention Provide advice, education, support and counseling about physical activity/exercise needs.;Develop an individualized exercise prescription for aerobic and resistive training based on initial evaluation findings, risk stratification, comorbidities and participant's personal goals.       Expected Outcomes Achievement of increased cardiorespiratory fitness and enhanced flexibility, muscular endurance and strength shown through measurements of functional capacity and personal statement of participant.       Increase Strength and Stamina Yes       Intervention Provide advice, education, support and counseling about physical activity/exercise needs.;Develop an individualized exercise prescription for aerobic and resistive training based on initial evaluation findings, risk stratification, comorbidities and participant's personal goals.       Expected Outcomes Achievement of increased cardiorespiratory fitness and enhanced flexibility, muscular endurance and strength shown  through measurements of functional capacity and personal statement of participant.          Exercise Goals Re-Evaluation :     Exercise Goals Re-Evaluation    Row Name 04/05/17 1110 04/13/17 0839 04/15/17 0935         Exercise Goal Re-Evaluation   Exercise Goals Review Increase Physical Activity;Increase Strenth and Stamina Increase Physical Activity;Increase Strenth and Stamina Increase Physical Activity;Increase Strenth and Stamina     Comments Wes has tolerated exercise well in his first sessions. Reviewed home exercise with pt today.  Pt plans to walk and ride bike at home for exercise.  He is going to have to make time to exercise despite his work schedule.  Reviewed THR, pulse, RPE, sign and symptoms, NTG use, and when to call 911 or MD.  Also discussed weather considerations and indoor options.  Pt voiced understanding. Wes has set the new program record for his post 6MWT at 2355 ft!!     Expected Outcomes Short - Gwynneth Macleod will attend HT regularly.  Long - Wes will feel comfortable exreciseing independently. Short: Make time to get exercise done.  Long: Exercise at home independently.  -        Discharge Exercise Prescription (Final Exercise Prescription Changes):     Exercise Prescription Changes - 04/13/17 0800      Home Exercise Plan   Plans to continue exercise at Home (comment)  walk and bike   Frequency Add 3 additional days to program exercise sessions.   Initial Home Exercises Provided 04/13/17      Nutrition:  Target Goals: Understanding of nutrition guidelines, daily intake of sodium <1579m, cholesterol <2057m calories 30% from fat and 7% or less from saturated fats, daily to have 5 or more servings of fruits and vegetables.  Biometrics:     Pre Biometrics - 03/24/17 1520      Pre Biometrics   Height 5' 9.6" (1.768 m)   Weight 196 lb 8 oz (89.1 kg)   Waist Circumference 37.75 inches   Hip Circumference 38.25 inches   Waist to Hip Ratio 0.99 %   BMI  (Calculated) 28.6   Single Leg Stand 30 seconds         Post Biometrics - 04/15/17 [id number]     Post  Biometrics   Height 5' 9.6" (1.768 m)   Weight 193 lb (87.5 kg)   Waist Circumference 37.25 inches   Hip Circumference 38.25 inches   Waist to Hip Ratio 0.97 %   BMI (Calculated) 28.1   Single Leg Stand 30 seconds      Nutrition Therapy Plan and Nutrition Goals:   Nutrition Discharge: Rate Your Plate Scores:     Nutrition Assessments - 04/15/17 0950      MEDFICTS Scores   Pre Score 6   Post Score 0   Score Difference -6      Nutrition Goals Re-Evaluation:   Nutrition Goals Discharge (Final Nutrition Goals Re-Evaluation):   Psychosocial: Target Goals: Acknowledge presence or absence of significant depression and/or stress, maximize coping skills, provide positive support system. Participant is able to verbalize types and ability to use techniques and skills needed for reducing stress and depression.   Initial Review & Psychosocial Screening:     Initial Psych Review & Screening - 03/24/17 1459      Initial Review   Current issues with None Identified     Family Dynamics   Good Support System? Yes  Wife and children      Barriers   Psychosocial barriers to participate in program There are no identifiable barriers or psychosocial needs.     Screening Interventions   Interventions Encouraged to exercise;Program counselor consult      Quality of Life Scores:      Quality of Life - 04/15/17 0950      Quality of Life Scores   Health/Function Pre 20.73 %   Health/Function Post 27.63 %   Health/Function % Change 33.29 %   Socioeconomic Pre 20.29 %   Socioeconomic Post 26.07 %   Socioeconomic % Change  28.49 %   Psych/Spiritual Pre 21 %   Psych/Spiritual Post 28.07 %   Psych/Spiritual % Change 33.67 %   Family Pre 21 %   Family Post 28.8 %   Family % Change 37.14 %   GLOBAL Pre 20.74 %   GLOBAL Post 27.57 %   GLOBAL % Change 32.93 %       PHQ-9: Recent Review Flowsheet Data    [facility] 04/15/2017 03/24/2017 11/19/2016 08/09/2015   Decreased Interest 0 0 0 0   Down, Depressed, Hopeless 0  0 0 0   PHQ - 2 Score 0 0 0 0   Altered sleeping 0 0 - -   Tired, decreased energy 0 0 - -   Change in appetite 0 0 - -   Feeling bad or failure about yourself  0 0 - -   Trouble concentrating 0 0 - -   Moving slowly or fidgety/restless 0 0 - -   Suicidal thoughts 0 0 - -   PHQ-9 Score 0 0 - -   Difficult doing work/chores Not difficult at all Not difficult at all - -     Interpretation of Total Score  Total Score Depression Severity:  1-4 = Minimal depression, 5-9 = Mild depression, 10-14 = Moderate depression, 15-19 = Moderately severe depression, 20-27 = Severe depression   Psychosocial Evaluation and Intervention:     Psychosocial Evaluation - 04/13/17 0904      Psychosocial Evaluation & Interventions   Interventions Encouraged to exercise with the program and follow exercise prescription;Stress management education;Relaxation education   Comments Counselor met with Mr. Royce [facility]) today for initial psychosocial evaluation.  He is a 56 year old who had a heart attack and stent inserted on 02/10/17.  Wes has a strong support system with a spouse of 59 years and (2) adult sons who live close by.  Wes reports not other major health issues currently.  He sleeps well and has a good appetite.  Wes denies a history of depression or anxiety or any current symptoms and is typically in a positive mood.  He has some stress with a recent move closer to where he works; and he returns to work this weekend with long hours and working night shift.  Wes is somewhat concerned about how this may impact his sleep; stress and heart health overall.  Counselor encouraged him to speak with his pharmacist about a natural sleep aid to help him get at least 6 hours per night once he returns to work.  Wes will also likely have to abbreviate his  time at [facility] due to his work schedule; but he plans to come to the educational components when possible.  Wes has goals to increase his education on how to be heart healthy and to know his exercise limits.  Staff will follow with Wes throughout the course of this program.     Expected Outcomes Wes will benefit from consistent exercise to achieve his stated goals.  He will also benefit from the educational components as well as the stress management and relaxation education to help cope with his work and job demands.  Staff will folllow with Wes.   Continue Psychosocial Services  Follow up required by staff      Psychosocial Re-Evaluation:   Psychosocial Discharge (Final Psychosocial Re-Evaluation):   Vocational Rehabilitation: Provide vocational rehab assistance to qualifying candidates.   Vocational Rehab Evaluation & Intervention:     Vocational Rehab - 03/24/17 1506      Initial Vocational Rehab Evaluation & Intervention   Assessment shows need for Vocational Rehabilitation No      Education: Education Goals: Education classes will be provided on a weekly basis, covering required topics. Participant will state understanding/return demonstration of topics presented.  Learning Barriers/Preferences:     Learning Barriers/Preferences - 03/24/17 1504      Learning Barriers/Preferences   Learning Barriers None   Learning Preferences None      Education Topics: General Nutrition Guidelines/Fats and Fiber: -Group instruction provided by verbal, written material, models  and posters to present the general guidelines for heart healthy nutrition. Gives an explanation and review of dietary fats and fiber.   Controlling Sodium/Reading Food Labels: -Group verbal and written material supporting the discussion of sodium use in heart healthy nutrition. Review and explanation with models, verbal and written materials for utilization of the food label.   Cardiac Rehab from 04/11/2017 in  [facility] Cardiac and Pulmonary Rehab  Date  03/28/17  Educator  [facility]  Instruction Review Code  2- meets goals/outcomes      Exercise Physiology & Risk Factors: - Group verbal and written instruction with models to review the exercise physiology of the cardiovascular system and associated critical values. Details cardiovascular disease risk factors and the goals associated with each risk factor.   Cardiac Rehab from 04/11/2017 in [facility] Cardiac and Pulmonary Rehab  Date  04/04/17  Educator  [facility]  Instruction Review Code  2- meets goals/outcomes      Aerobic Exercise & Resistance Training: - Gives group verbal and written discussion on the health impact of inactivity. On the components of aerobic and resistive training programs and the benefits of this training and how to safely progress through these programs.   Cardiac Rehab from 04/11/2017 in [facility] Cardiac and Pulmonary Rehab  Date  04/06/17  Educator  [facility]  Instruction Review Code  2- meets goals/outcomes      Flexibility, Balance, General Exercise Guidelines: - Provides group verbal and written instruction on the benefits of flexibility and balance training programs. Provides general exercise guidelines with specific guidelines to those with heart or lung disease. Demonstration and skill practice provided.   Cardiac Rehab from 04/11/2017 in [facility] Cardiac and Pulmonary Rehab  Date  04/11/17  Educator  [facility]  Instruction Review Code  2- meets goals/outcomes      Stress Management: - Provides group verbal and written instruction about the health risks of elevated stress, cause of high stress, and healthy ways to reduce stress.   Depression: - Provides group verbal and written instruction on the correlation between heart/lung disease and depressed mood, treatment options, and the stigmas associated with seeking treatment.   Anatomy & Physiology of the Heart: - Group verbal and written instruction and models provide basic cardiac anatomy and  physiology, with the coronary electrical and arterial systems. Review of: AMI, Angina, Valve disease, Heart Failure, Cardiac Arrhythmia, Pacemakers, and the ICD.   Cardiac Procedures: - Group verbal and written instruction and models to describe the testing methods done to diagnose heart disease. Reviews the outcomes of the test results. Describes the treatment choices: Medical Management, Angioplasty, or Coronary Bypass Surgery.   Cardiac Medications: - Group verbal and written instruction to review commonly prescribed medications for heart disease. Reviews the medication, class of the drug, and side effects. Includes the steps to properly store meds and maintain the prescription regimen.   Go Sex-Intimacy & Heart Disease, Get SMART - Goal Setting: - Group verbal and written instruction through game format to discuss heart disease and the return to sexual intimacy. Provides group verbal and written material to discuss and apply goal setting through the application of the S.M.A.R.T. Method.   Other Matters of the Heart: - Provides group verbal, written materials and models to describe Heart Failure, Angina, Valve Disease, and Diabetes in the realm of heart disease. Includes description of the disease process and treatment options available to the cardiac patient.   Exercise & Equipment Safety: - Individual verbal instruction and demonstration of equipment use and safety with  use of the equipment.   Cardiac Rehab from 04/11/2017 in [facility] Cardiac and Pulmonary Rehab  Date  03/24/17  Educator  [facility]  Instruction Review Code  2- meets goals/outcomes      Infection Prevention: - Provides verbal and written material to individual with discussion of infection control including proper hand washing and proper equipment cleaning during exercise session.   Cardiac Rehab from 04/11/2017 in [facility] Cardiac and Pulmonary Rehab  Date  03/24/17  Educator  [facility]  Instruction Review Code  2- meets goals/outcomes       Falls Prevention: - Provides verbal and written material to individual with discussion of falls prevention and safety.   Cardiac Rehab from 04/11/2017 in [facility] Cardiac and Pulmonary Rehab  Date  03/24/17  Educator  [facility]  Instruction Review Code  2- meets goals/outcomes      Diabetes: - Individual verbal and written instruction to review signs/symptoms of diabetes, desired ranges of glucose level fasting, after meals and with exercise. Advice that pre and post exercise glucose checks will be done for 3 sessions at entry of program.   Cardiac Rehab from 04/11/2017 in [facility] Cardiac and Pulmonary Rehab  Date  03/24/17  Educator  mc  Instruction Review Code  2- meets goals/outcomes       Knowledge Questionnaire Score:     Knowledge Questionnaire Score - 04/15/17 0950      Knowledge Questionnaire Score   Pre Score 26/28   Post Score 28/28      Core Components/Risk Factors/Patient Goals at Admission:     Personal Goals and Risk Factors at Admission - 03/24/17 1446      Core Components/Risk Factors/Patient Goals on Admission    Weight Management Weight Loss;Yes   Intervention Weight Management: Develop a combined nutrition and exercise program designed to reach desired caloric intake, while maintaining appropriate intake of nutrient and fiber, sodium and fats, and appropriate energy expenditure required for the weight goal.;Weight Management: Provide education and appropriate resources to help participant work on and attain dietary goals.;Weight Management/Obesity: Establish reasonable short term and long term weight goals.   Admit Weight 196 lb 8 oz (89.1 kg)   Goal Weight: Short Term 193 lb (87.5 kg)   Goal Weight: Long Term 180 lb (81.6 kg)   Hypertension Yes   Intervention Provide education on lifestyle modifcations including regular physical activity/exercise, weight management, moderate sodium restriction and increased consumption of fresh fruit, vegetables, and low fat  dairy, alcohol moderation, and smoking cessation.;Monitor prescription use compliance.   Expected Outcomes Short Term: Continued assessment and intervention until BP is < 140/28m HG in hypertensive participants. < 130/838mHG in hypertensive participants with diabetes, heart failure or chronic kidney disease.;Long Term: Maintenance of blood pressure at goal levels.   Lipids Yes   Intervention Provide education and support for participant on nutrition & aerobic/resistive exercise along with prescribed medications to achieve LDL <7055mHDL >53m105m Expected Outcomes Short Term: Participant states understanding of desired cholesterol values and is compliant with medications prescribed. Participant is following exercise prescription and nutrition guidelines.;Long Term: Cholesterol controlled with medications as prescribed, with individualized exercise RX and with personalized nutrition plan. Value goals: LDL < 70mg74mL > 40 mg.      Core Components/Risk Factors/Patient Goals Review:    Core Components/Risk Factors/Patient Goals at Discharge (Final Review):    ITP Comments:     ITP Comments    Row Name 03/24/17 1441 04/13/17 0627 95630/18 0935  ITP Comments Med Review completed, initial ITP created. Diagnosis can be found in MD encounter 5/19 30 day review. Continue with ITP unless directed changes per Medical Director review    Gwynneth Macleod is "graduating" today.  He has had a change in his work schedule that will now allow him to come to exercise.  He is planning to try to come to some of the education classes to hear the information.  He was given a copy of the August schedule and will bounce around to hear the classes when he is in town.        Comments: Discharge ITP

## 2017-06-03 ENCOUNTER — Encounter: Payer: Self-pay | Admitting: Urgent Care

## 2017-06-03 ENCOUNTER — Ambulatory Visit (INDEPENDENT_AMBULATORY_CARE_PROVIDER_SITE_OTHER): Payer: BLUE CROSS/BLUE SHIELD | Admitting: Urgent Care

## 2017-06-03 VITALS — BP 126/79 | HR 55 | Temp 97.9°F | Resp 17 | Ht 70.5 in | Wt 189.0 lb

## 2017-06-03 DIAGNOSIS — I10 Essential (primary) hypertension: Secondary | ICD-10-CM | POA: Diagnosis not present

## 2017-06-03 DIAGNOSIS — E782 Mixed hyperlipidemia: Secondary | ICD-10-CM | POA: Diagnosis not present

## 2017-06-03 DIAGNOSIS — Z8739 Personal history of other diseases of the musculoskeletal system and connective tissue: Secondary | ICD-10-CM | POA: Diagnosis not present

## 2017-06-03 DIAGNOSIS — I2510 Atherosclerotic heart disease of native coronary artery without angina pectoris: Secondary | ICD-10-CM

## 2017-06-03 DIAGNOSIS — I252 Old myocardial infarction: Secondary | ICD-10-CM | POA: Diagnosis not present

## 2017-06-03 DIAGNOSIS — M791 Myalgia, unspecified site: Secondary | ICD-10-CM

## 2017-06-03 NOTE — Progress Notes (Signed)
    MRN: [id number] DOB: 03/23/1961  Subjective:   Troy Anderson is a 56 y.o. male presenting for follow up on MI. He has been managed and stabilized with Dr. Clayborn Bigness through [facility]. He is on statin therapy, aspirin, clopidogrel (given PCI s/p stent placement), lisinopril, metoprolol. Has a script for nitroglycerin, has not needed to use this. Has completed his cardiac rehab. He is exercising regularly, runs several miles per week. Denies smoking cigarettes or drinking alcohol. Admits that he has intermittent myalgia since increasing atorvastatin from 40mg  to 80mg , for now states that he can tolerate this. Denies dizziness, chronic headache, blurred vision, chest pain, shortness of breath, heart racing, palpitations, nausea, vomiting, abdominal pain, hematuria, lower leg swelling.   Troy Anderson has a current medication list which includes the following prescription(s): aspirin, atorvastatin, clopidogrel, lisinopril, metoprolol succinate, and nitroglycerin. Also has No Known Allergies.  Troy Anderson  has a past medical history of Hyperlipidemia and Hypertension. Also  has a past surgical history that includes LEFT HEART CATH AND CORONARY ANGIOGRAPHY (N/A, 02/10/2017).  Objective:   Vitals: BP 126/79   Pulse (!) 55   Temp 97.9 F (36.6 C) (Oral)   Resp 17   Ht 5' 10.5" (1.791 m)   Wt 189 lb (85.7 kg)   SpO2 98%   BMI 26.74 kg/m   Wt Readings from Last 3 Encounters:  06/03/17 189 lb (85.7 kg)  04/15/17 193 lb (87.5 kg)  03/24/17 196 lb 8 oz (89.1 kg)   BP Readings from Last 3 Encounters:  06/03/17 126/79  02/12/17 131/75  11/19/16 130/90    Physical Exam  Constitutional: He is oriented to person, place, and time. He appears well-developed and well-nourished.  HENT:  Mouth/Throat: Oropharynx is clear and moist.  Eyes: No scleral icterus.  Cardiovascular: Normal rate, regular rhythm and intact distal pulses.  Exam reveals no gallop and no friction rub.   No murmur  heard. Pulmonary/Chest: No respiratory distress. He has no wheezes. He has no rales.  Abdominal: Soft. Bowel sounds are normal. He exhibits no distension and no mass. There is no tenderness. There is no guarding.  Musculoskeletal: He exhibits no edema.  Neurological: He is alert and oriented to person, place, and time.  Skin: Skin is warm and dry.   Assessment and Plan :   1. Coronary artery disease involving native heart without angina pectoris, unspecified vessel or lesion type 2. History of MI (myocardial infarction) 3. Essential hypertension 4. Mixed hyperlipidemia - Stable, labs pending. Patient has medication refills through his cardiologist. Faythe Ghee to call for refills through our office for up to 1 year from today's visit. Counseled patient on potential for adverse effects with medications prescribed today, patient verbalized understanding.   5. History of gout 6. Myalgia - Labs pending.  Jaynee Eagles, PA-C [facility] [phone] 06/03/2017 10:47 AM

## 2017-06-03 NOTE — Patient Instructions (Addendum)
Coronary Artery Disease, Male  Coronary artery disease (CAD) is a condition in which the arteries that lead to the heart (coronary arteries) become narrow or blocked. The narrowing or blockage can lead to decreased blood flow to the heart. Prolonged reduced blood flow can cause a heart attack (myocardial infarction or MI). This condition may also be called coronary heart disease.  Because CAD is the leading cause of death in men, it is important to understand what causes this condition and how it is treated.  What are the causes?  CAD is most often caused by atherosclerosis. This is the buildup of fat and cholesterol (plaque) on the inside of the arteries. Over time, the plaque may narrow or block the artery, reducing blood flow to the heart. Plaque can also become weak and break off within a coronary artery and cause a sudden blockage. Other less common causes of CAD include:   An embolism or blood clot in a coronary artery.   A tearing of the artery (spontaneous coronary artery dissection).   An aneurysm.   Inflammation (vasculitis) in the artery wall.    What increases the risk?  The following factors may make you more likely to develop this condition:   Age. Men over age 45 are at a greater risk of CAD.   Family history of CAD.   Gender. Men often develop CAD earlier in life than women.   High blood pressure (hypertension).   Diabetes.   High cholesterol levels.   Tobacco use.   Excessive alcohol use.   Lack of exercise.   A diet high in saturated and trans fats, such as fried food and processed meat.    Other possible risk factors include:   High stress levels.   Depression.   Obesity.   Sleep apnea.    What are the signs or symptoms?  Many people do not have any symptoms during the early stages of CAD. As the condition progresses, symptoms may include:   Chest pain (angina). The pain can:  ? Feel like a crushing or squeezing, or a tightness, pressure, fullness, or heaviness in the  chest.  ? Last more than a few minutes or can stop and recur. The pain tends to get worse with exercise or stress and to fade with rest.   Pain in the arms, neck, jaw, or back.   Unexplained heartburn or indigestion.   Shortness of breath.   Nausea or vomiting.   Sudden light-headedness.   Sudden cold sweats.   Fluttering or fast heartbeat (palpitations).    How is this diagnosed?  This condition is diagnosed based on:   Your family and medical history.   A physical exam.   Tests, including:  ? A test to check the electrical signals in your heart (electrocardiogram).  ? Exercise stress test. This looks for signs of blockage when the heart is stressed with exercise, such as running on a treadmill.  ? Pharmacologic stress test. This test looks for signs of blockage when the heart is being stressed with a medicine.  ? Blood tests.  ? Coronary angiogram. This is a procedure to look at the coronary arteries to see if there is any blockage. During this test, a dye is injected into your arteries so they appear on an X-ray.  ? A test that uses sound waves to take a picture of your heart (echocardiogram).  ? Chest X-ray.    How is this treated?  This condition may be treated by:     Healthy lifestyle changes to reduce risk factors.   Medicines such as:  ? Antiplatelet medicines and blood-thinning medicines, such as aspirin. These help to prevent blood clots.  ? Nitroglycerin.  ? Blood pressure medicines.  ? Cholesterol-lowering medicine.   Coronary angioplasty and stenting. During this procedure, a thin, flexible tube is inserted through a blood vessel and into a blocked artery. A balloon or similar device on the end of the tube is inflated to open up the artery. In some cases, a small, mesh tube (stent) is inserted into the artery to keep it open.   Coronary artery bypass surgery. During this surgery, veins or arteries from other parts of the body are used to create a bypass around the blockage and allow blood  to reach your heart.    Follow these instructions at home:  Medicines   Take over-the-counter and prescription medicines only as told by your health care provider.   Do not take the following medicines unless your health care provider approves:  ? NSAIDs, such as ibuprofen, naproxen, or celecoxib.  ? Vitamin supplements that contain vitamin A, vitamin E, or both.  Lifestyle   Follow an exercise program approved by your health care provider. Aim for 150 minutes of moderate exercise or 75 minutes of vigorous exercise each week.   Maintain a healthy weight or lose weight as approved by your health care provider.   Rest when you are tired.   Learn to manage stress or try to limit your stress. Ask your health care provider for suggestions if you need help.   Get screened for depression and seek treatment, if needed.   Do not use any products that contain nicotine or tobacco, such as cigarettes and e-cigarettes. If you need help quitting, ask your health care provider.   Do not use illegal drugs.  Eating and drinking   Follow a heart-healthy diet. A dietitian can help educate you about healthy food options and changes. In general, eat plenty of fruits and vegetables, lean meats, and whole grains.   Avoid foods high in:  ? Sugar.  ? Salt (sodium).  ? Saturated fat, such as processed or fatty meat.  ? Trans fat, such as fried foods.   Use healthy cooking methods such as roasting, grilling, broiling, baking, poaching, steaming, or stir-frying.   If you drink alcohol, and your health care provider approves, limit your alcohol intake to no more than 2 drinks per day. One drink equals 12 ounces of beer, 5 ounces of wine, or 1 ounces of hard liquor.  General instructions   Manage any other health conditions, such as hypertension and diabetes. These conditions affect your heart.   Your health care provider may ask you to monitor your blood pressure. Ideally, your blood pressure should be below 130/80.   Keep all  follow-up visits as told by your health care provider. This is important.  Get help right away if:   You have pain in your chest, neck, arm, jaw, stomach, or back that:  ? Lasts more than a few minutes.  ? Is recurring.  ? Is not relieved by taking medicine under your tongue (sublingualnitroglycerin).   You have too much (profuse) sweating without cause.   You have unexplained:  ? Heartburn or indigestion.  ? Shortness of breath or difficulty breathing.  ? Fluttering or fast heartbeat (palpitations).  ? Nausea or vomiting.  ? Fatigue.  ? Feelings of nervousness or anxiety.  ? Weakness.  ? Diarrhea.   You   the early stages of CAD. This is called "silent CAD."  CAD can be treated with lifestyle changes, medicines, surgery, or a combination of these treatments. This information is not intended to replace advice given to you by your health care provider. Make sure you discuss any questions you have with your health care provider. Document Released: 04/10/2014 Document Revised: 09/03/2016 Document Reviewed: 09/03/2016 Elsevier Interactive Patient Education  2017 [employer].     IF you received an x-ray today, you will receive an invoice from [facility]. Please contact [facility] at [phone] with questions or concerns regarding your invoice.   IF you received labwork today,  you will receive an invoice from [place]. Please contact [facility] at [phone] with questions or concerns regarding your invoice.   Our billing staff will not be able to assist you with questions regarding bills from these companies.  You will be contacted with the lab results as soon as they are available. The fastest way to get your results is to activate your My Chart account. Instructions are located on the last page of this paperwork. If you have not heard from [place] regarding the results in 2 weeks, please contact this office.

## 2017-06-04 LAB — COMPREHENSIVE METABOLIC PANEL
A/G RATIO: 1.8 (ref 1.2–2.2)
ALT: 32 IU/L (ref 0–44)
AST: 24 IU/L (ref 0–40)
Albumin: 4.6 g/dL (ref 3.5–5.5)
Alkaline Phosphatase: 42 IU/L (ref 39–117)
BILIRUBIN TOTAL: 0.6 mg/dL (ref 0.0–1.2)
BUN / CREAT RATIO: 13 (ref 9–20)
BUN: 12 mg/dL (ref 6–24)
CALCIUM: 9.7 mg/dL (ref 8.7–10.2)
CO2: 23 mmol/L (ref 20–29)
Chloride: 101 mmol/L (ref 96–106)
Creatinine, Ser: 0.95 mg/dL (ref 0.76–1.27)
GFR, EST AFRICAN AMERICAN: 104 mL/min/{1.73_m2} (ref >59)
GFR, EST NON AFRICAN AMERICAN: 90 mL/min/{1.73_m2} (ref >59)
Globulin, Total: 2.6 g/dL (ref 1.5–4.5)
Glucose: 90 mg/dL (ref 65–99)
POTASSIUM: 4.2 mmol/L (ref 3.5–5.2)
SODIUM: 139 mmol/L (ref 134–144)
TOTAL PROTEIN: 7.2 g/dL (ref 6.0–8.5)

## 2017-06-04 LAB — LIPID PANEL
CHOL/HDL RATIO: 2.8 ratio (ref 0.0–5.0)
Cholesterol, Total: 118 mg/dL (ref 100–199)
HDL: 42 mg/dL (ref >39)
LDL Calculated: 46 mg/dL (ref 0–99)
Triglycerides: 151 mg/dL — ABNORMAL HIGH (ref 0–149)
VLDL Cholesterol Cal: 30 mg/dL (ref 5–40)

## 2017-06-04 LAB — CK: CK TOTAL: 850 U/L — AB (ref 24–204)

## 2017-06-04 LAB — URIC ACID: URIC ACID: 7 mg/dL (ref 3.7–8.6)

## 2017-06-06 ENCOUNTER — Encounter: Payer: Self-pay | Admitting: Family Medicine

## 2017-06-06 ENCOUNTER — Ambulatory Visit: Payer: BLUE CROSS/BLUE SHIELD | Admitting: Family Medicine

## 2017-06-06 ENCOUNTER — Ambulatory Visit (INDEPENDENT_AMBULATORY_CARE_PROVIDER_SITE_OTHER): Payer: BLUE CROSS/BLUE SHIELD | Admitting: Family Medicine

## 2017-06-06 VITALS — BP 129/78 | HR 74 | Temp 97.7°F | Resp 18 | Ht 69.37 in | Wt 191.0 lb

## 2017-06-06 DIAGNOSIS — M6282 Rhabdomyolysis: Secondary | ICD-10-CM | POA: Diagnosis not present

## 2017-06-06 DIAGNOSIS — I252 Old myocardial infarction: Secondary | ICD-10-CM | POA: Diagnosis not present

## 2017-06-06 MED ORDER — ATORVASTATIN CALCIUM 80 MG PO TABS: 40.0000 mg | ORAL_TABLET | Freq: Every day | ORAL | 0 refills | Status: DC

## 2017-06-06 MED ORDER — SODIUM CHLORIDE 0.9 % IV BOLUS (SEPSIS)
1000.0000 mL | Freq: Once | INTRAVENOUS | Status: AC
  Administered 2017-06-06: 1000 mL via INTRAVENOUS

## 2017-06-06 NOTE — Progress Notes (Signed)
9/10/20183:35 PM  Troy Anderson [birth year], 56 y.o. male [id number]  Chief Complaint  Patient presents with  . Lab    f/u    HPI:   Patient is a 56 y.o. male with past medical history significant for MI who presents today for follow-up as requested by his PCP after CK came back elevated in the 800s for treatment with IVF.  Patient reports that since cards increased his atorvastatin from 40mg  to 80mg  he has been having muscle pain, calves, thighs, after he exercises. He did not have these issues at 40mg  a day. He had worked out the day before last blood test. He normally does several hours of vigorous cardio 3-4 times a week. He has lost weight in the past 2 1/2 months intentionally, trying to become healthier after his MI.   Depression screen [facility] 2/9 06/06/2017 06/03/2017 04/15/2017  Decreased Interest 0 0 0  Down, Depressed, Hopeless 0 0 0  PHQ - 2 Score 0 0 0  Altered sleeping - - 0  Tired, decreased energy - - 0  Change in appetite - - 0  Feeling bad or failure about yourself  - - 0  Trouble concentrating - - 0  Moving slowly or fidgety/restless - - 0  Suicidal thoughts - - 0  PHQ-9 Score - - 0  Difficult doing work/chores - - Not difficult at all    No Known Allergies  Current Outpatient Prescriptions on File Prior to Visit  Medication Sig Dispense Refill  . aspirin 81 MG chewable tablet Chew 1 tablet (81 mg total) by mouth daily.    . clopidogrel (PLAVIX) 75 MG tablet Take 1 tablet (75 mg total) by mouth daily with breakfast. 30 tablet 0  . lisinopril (PRINIVIL,ZESTRIL) 10 MG tablet Take 1 tablet (10 mg total) by mouth daily. 30 tablet 0  . metoprolol succinate (TOPROL-XL) 25 MG 24 hr tablet Take 1 tablet (25 mg total) by mouth daily. 60 tablet 0  . nitroGLYCERIN (NITROSTAT) 0.4 MG SL tablet Place 1 tablet (0.4 mg total) under the tongue every 5 (five) minutes as needed for chest pain. 30 tablet 0   No current facility-administered medications on file prior to visit.      Past Medical History:  Diagnosis Date  . Hyperlipidemia   . Hypertension     Past Surgical History:  Procedure Laterality Date  . LEFT HEART CATH AND CORONARY ANGIOGRAPHY N/A 02/10/2017   Procedure: Left Heart Cath and Coronary Angiography;  Surgeon: Yolonda Kida, MD;  Location: [facility];  Service: Cardiovascular;  Laterality: N/A;    Social History  Substance Use Topics  . Smoking status: Never Smoker  . Smokeless tobacco: Never Used  . Alcohol use Yes     Comment: social    Family History  Problem Relation Age of Onset  . Cancer Mother   . Colon cancer Mother   . Stomach cancer Mother   . Diabetes Father   . Esophageal cancer Neg Hx   . Rectal cancer Neg Hx     Review of Systems  Constitutional: Negative for chills and fever.  Respiratory: Negative for cough and shortness of breath.   Cardiovascular: Negative for chest pain, palpitations and leg swelling.  Gastrointestinal: Negative for abdominal pain, nausea and vomiting.  Genitourinary: Negative for hematuria.  Musculoskeletal: Positive for myalgias.     OBJECTIVE:  Blood pressure 129/78, pulse 74, temperature 97.7 F (36.5 C), temperature source Oral, resp. rate 18, height 5' 9.37" (1.762  m), weight 191 lb (86.6 kg), SpO2 98 %.  Physical Exam  Constitutional: He is oriented to person, place, and time and well-developed, well-nourished, and in no distress.  HENT:  Head: Normocephalic and atraumatic.  Mouth/Throat: Oropharynx is clear and moist.  Eyes: Pupils are equal, round, and reactive to light. EOM are normal.  Neck: Neck supple.  Cardiovascular: Normal rate and regular rhythm.  Exam reveals no gallop and no friction rub.   No murmur heard. Pulmonary/Chest: Effort normal and breath sounds normal. He has no wheezes. He has no rales.  Neurological: He is alert and oriented to person, place, and time. Gait normal.  Skin: Skin is warm and dry.    Results for orders placed or  performed in visit on 06/03/17  Comprehensive metabolic panel  Result Value Ref Range   Glucose 90 65 - 99 mg/dL   BUN 12 6 - 24 mg/dL   Creatinine, Ser 0.95 0.76 - 1.27 mg/dL   GFR calc non Af Amer 90 >59 mL/min/1.73   GFR calc Af Amer 104 >59 mL/min/1.73   BUN/Creatinine Ratio 13 9 - 20   Sodium 139 134 - 144 mmol/L   Potassium 4.2 3.5 - 5.2 mmol/L   Chloride 101 96 - 106 mmol/L   CO2 23 20 - 29 mmol/L   Calcium 9.7 8.7 - 10.2 mg/dL   Total Protein 7.2 6.0 - 8.5 g/dL   Albumin 4.6 3.5 - 5.5 g/dL   Globulin, Total 2.6 1.5 - 4.5 g/dL   Albumin/Globulin Ratio 1.8 1.2 - 2.2   Bilirubin Total 0.6 0.0 - 1.2 mg/dL   Alkaline Phosphatase 42 39 - 117 IU/L   AST 24 0 - 40 IU/L   ALT 32 0 - 44 IU/L  Lipid panel  Result Value Ref Range   Cholesterol, Total 118 100 - 199 mg/dL   Triglycerides 151 (H) 0 - 149 mg/dL   HDL 42 >39 mg/dL   VLDL Cholesterol Cal 30 5 - 40 mg/dL   LDL Calculated 46 0 - 99 mg/dL   Chol/HDL Ratio 2.8 0.0 - 5.0 ratio  Uric Acid  Result Value Ref Range   Uric Acid 7.0 3.7 - 8.6 mg/dL  CK  Result Value Ref Range   Total CK 850 (HH) 24 - 204 U/L     ASSESSMENT and PLAN:  1. Non-traumatic rhabdomyolysis Patient presented today for IVF after being instructed to come in. NS 1L bolus given. Labs repeated. Discussed decreasing atorvastatin to 40mg  at bedtime, last LDL 46. Discussed repeating CK in 1 week after decrease in atorvastatin. Keep fu with cards as scheduled.  - Insert peripheral IV - CK - Basic Metabolic Panel - CK; Future - sodium chloride 0.9 % bolus 1,000 mL; Inject 1,000 mLs into the vein once.  2. History of Myocardial infarction fu with cards as scheduled to discuss further mgt of statin. Current LDL at goal < 70.    Rutherford Guys, MD Primary Care at [facility] [place], [place] [address] Ph.  [phone] Fax [phone]

## 2017-06-06 NOTE — Patient Instructions (Signed)
     IF you received an x-ray today, you will receive an invoice from [facility]. Please contact [facility] at [phone] with questions or concerns regarding your invoice.   IF you received labwork today, you will receive an invoice from [facility]. Please contact [facility] at [phone] with questions or concerns regarding your invoice.   Our billing staff will not be able to assist you with questions regarding bills from these companies.  You will be contacted with the lab results as soon as they are available. The fastest way to get your results is to activate your My Chart account. Instructions are located on the last page of this paperwork. If you have not heard from us regarding the results in 2 weeks, please contact this office.     

## 2017-06-07 LAB — BASIC METABOLIC PANEL
BUN/Creatinine Ratio: 11 (ref 9–20)
BUN: 10 mg/dL (ref 6–24)
CO2: 20 mmol/L (ref 20–29)
Calcium: 9.5 mg/dL (ref 8.7–10.2)
Chloride: 100 mmol/L (ref 96–106)
Creatinine, Ser: 0.92 mg/dL (ref 0.76–1.27)
GFR calc Af Amer: 109 mL/min/{1.73_m2} (ref >59)
GFR calc non Af Amer: 94 mL/min/{1.73_m2} (ref >59)
Glucose: 174 mg/dL — ABNORMAL HIGH (ref 65–99)
Potassium: 4 mmol/L (ref 3.5–5.2)
Sodium: 143 mmol/L (ref 134–144)

## 2017-06-07 LAB — CK: Total CK: 682 U/L (ref 24–204)

## 2017-06-14 ENCOUNTER — Ambulatory Visit (INDEPENDENT_AMBULATORY_CARE_PROVIDER_SITE_OTHER): Payer: BLUE CROSS/BLUE SHIELD | Admitting: Family Medicine

## 2017-06-14 DIAGNOSIS — M6282 Rhabdomyolysis: Secondary | ICD-10-CM

## 2017-06-15 LAB — CK: Total CK: 467 U/L — ABNORMAL HIGH (ref 24–204)

## 2017-06-17 NOTE — Progress Notes (Signed)
Lab visit only. 

## 2017-06-20 ENCOUNTER — Telehealth: Payer: Self-pay | Admitting: Family Medicine

## 2017-06-20 NOTE — Telephone Encounter (Signed)
Call --- CK improving last week on 06/14/17 with level at 467.  Dr. Pamella Pert will contact patient with further instructions.

## 2017-06-20 NOTE — Telephone Encounter (Signed)
Pt had labs a week ago and has not heard from [place] or seen the results released to mychart.  [phone]

## 2017-06-21 NOTE — Telephone Encounter (Signed)
Are you going to contact this patient ? Or would you like me to ?

## 2017-06-23 NOTE — Telephone Encounter (Signed)
Patient is supposed to fu with me. Please call and have him schedule an appointment. thanks

## 2017-06-24 NOTE — Telephone Encounter (Signed)
LVM to call back.

## 2017-06-24 NOTE — Telephone Encounter (Signed)
Spoke with patient and advised that he needed to schedule a f/u visit with Dr. Pamella Pert. Transferred to scheduling to make an appointment.

## 2017-06-28 ENCOUNTER — Ambulatory Visit (INDEPENDENT_AMBULATORY_CARE_PROVIDER_SITE_OTHER): Payer: BLUE CROSS/BLUE SHIELD | Admitting: Family Medicine

## 2017-06-28 ENCOUNTER — Encounter: Payer: Self-pay | Admitting: Family Medicine

## 2017-06-28 VITALS — BP 118/78 | HR 64 | Temp 98.0°F | Resp 16 | Ht 70.08 in | Wt 188.0 lb

## 2017-06-28 DIAGNOSIS — I252 Old myocardial infarction: Secondary | ICD-10-CM | POA: Diagnosis not present

## 2017-06-28 DIAGNOSIS — M6282 Rhabdomyolysis: Secondary | ICD-10-CM | POA: Diagnosis not present

## 2017-06-28 NOTE — Patient Instructions (Signed)
     IF you received an x-ray today, you will receive an invoice from [facility]. Please contact [facility] at [phone] with questions or concerns regarding your invoice.   IF you received labwork today, you will receive an invoice from [facility]. Please contact [facility] at [phone] with questions or concerns regarding your invoice.   Our billing staff will not be able to assist you with questions regarding bills from these companies.  You will be contacted with the lab results as soon as they are available. The fastest way to get your results is to activate your My Chart account. Instructions are located on the last page of this paperwork. If you have not heard from us regarding the results in 2 weeks, please contact this office.     

## 2017-06-28 NOTE — Assessment & Plan Note (Signed)
Significantly improved. Mild symptoms only when does long sessions of running or other cardio exercise that resolve with aggressive hydration. Discussed decreasing duration of exercise sessions. Continue with current dose of atorvastatin, discuss further at next cards appt. RTC precautions given.

## 2017-06-28 NOTE — Progress Notes (Signed)
10/2/20181:10 PM  Troy Anderson 06-10-1961, 56 y.o. male [id number]  Chief Complaint  Patient presents with  . Non-Traumatic rhabdomyolysis    follow-up    HPI:   Patient is a 56 y.o. male who presents today for fu on recent episode of mild rhabdomyolysis. Last visit decreased atorvastatin to 40 mg, treated with IVF. Myalgias resolved except for on days when he exercises for over an hour, he will have mild myalgias which resolve with aggressive hydration.   Depression screen [facility] 2/9 06/28/2017 06/06/2017 06/03/2017  Decreased Interest 0 0 0  Down, Depressed, Hopeless 0 0 0  PHQ - 2 Score 0 0 0  Altered sleeping - - -  Tired, decreased energy - - -  Change in appetite - - -  Feeling bad or failure about yourself  - - -  Trouble concentrating - - -  Moving slowly or fidgety/restless - - -  Suicidal thoughts - - -  PHQ-9 Score - - -  Difficult doing work/chores - - -    No Known Allergies  Prior to Admission medications   Medication Sig Start Date End Date Taking? Authorizing Provider  aspirin 81 MG chewable tablet Chew 1 tablet (81 mg total) by mouth daily. 02/12/17  Yes Bettey Costa, MD  atorvastatin (LIPITOR) 80 MG tablet Take 0.5 tablets (40 mg total) by mouth daily at 6 PM. 06/06/17  Yes Rutherford Guys, MD  clopidogrel (PLAVIX) 75 MG tablet Take 1 tablet (75 mg total) by mouth daily with breakfast. 02/13/17  Yes Mody, Sital, MD  lisinopril (PRINIVIL,ZESTRIL) 10 MG tablet Take 1 tablet (10 mg total) by mouth daily. 02/12/17  Yes Mody, Ulice Bold, MD  metoprolol succinate (TOPROL-XL) 25 MG 24 hr tablet Take 1 tablet (25 mg total) by mouth daily. 02/12/17  Yes Mody, Ulice Bold, MD  nitroGLYCERIN (NITROSTAT) 0.4 MG SL tablet Place 1 tablet (0.4 mg total) under the tongue every 5 (five) minutes as needed for chest pain. 02/12/17  Yes Bettey Costa, MD    Past Medical History:  Diagnosis Date  . Hyperlipidemia   . Hypertension     Past Surgical History:  Procedure Laterality Date  . LEFT  HEART CATH AND CORONARY ANGIOGRAPHY N/A 02/10/2017   Procedure: Left Heart Cath and Coronary Angiography;  Surgeon: Yolonda Kida, MD;  Location: [facility];  Service: Cardiovascular;  Laterality: N/A;    Social History  Substance Use Topics  . Smoking status: Never Smoker  . Smokeless tobacco: Never Used  . Alcohol use Yes     Comment: social    Family History  Problem Relation Age of Onset  . Cancer Mother   . Colon cancer Mother   . Stomach cancer Mother   . Diabetes Father   . Esophageal cancer Neg Hx   . Rectal cancer Neg Hx     ROS Per hpi  OBJECTIVE:  Blood pressure 118/78, pulse 64, temperature 98 F (36.7 C), temperature source Oral, resp. rate 16, height 5' 10.08" (1.78 m), weight 188 lb (85.3 kg), SpO2 97 %.  Physical Exam  Constitutional: He is oriented to person, place, and time and well-developed, well-nourished, and in no distress.  HENT:  Head: Normocephalic and atraumatic.  Mouth/Throat: Oropharynx is clear and moist.  Eyes: Pupils are equal, round, and reactive to light. EOM are normal.  Neck: Neck supple.  Pulmonary/Chest: Effort normal.  Neurological: He is alert and oriented to person, place, and time. Gait normal.  Skin: Skin is warm and  dry.  Psychiatric: Mood and affect normal.    ASSESSMENT and PLAN  Problem List Items Addressed This Visit    Non-traumatic rhabdomyolysis - Primary    Significantly improved. Mild symptoms only when does long sessions of running or other cardio exercise that resolve with aggressive hydration. Discussed decreasing duration of exercise sessions. Continue with current dose of atorvastatin, discuss further at next cards appt. RTC precautions given.        Other Visit Diagnoses    History of myocardial infarction        BP at goal, working on losing weight, regular exercise. Fu with cards as scheduled.   Return if symptoms worsen or fail to improve.    Rutherford Guys, MD Primary Care at  [facility] [address] [place], [place] [address] Ph.  [phone] Fax [phone]

## 2017-09-13 ENCOUNTER — Telehealth: Payer: Self-pay | Admitting: Family Medicine

## 2017-09-13 NOTE — Telephone Encounter (Signed)
Copied from [place]. Topic: General - Other >> Sep 13, 2017  4:28 PM Darl Householder, RMA wrote: Reason for CRM: patient called to notify Dr. Pamella Pert of CK levels, please have Dr. Pamella Pert call pt back at [phone]

## 2017-09-15 NOTE — Telephone Encounter (Signed)
L/m message for patient to call back

## 2017-09-16 NOTE — Telephone Encounter (Signed)
Pt called back and said just leave the message on mychart.

## 2017-09-23 NOTE — Telephone Encounter (Signed)
Pt tried to send in the results thru mychart but is unsure if they have been seen, if [facility] could contact pt to make sure that they have them, call or contact thru [employer]

## 2017-09-23 NOTE — Telephone Encounter (Signed)
This was not ordered by me, it was ordered by a physician that seems not to be on epic. Does he have follow-up with this physician? Has he discussed this with his cardiologist as it seems that we might need to stop his cholesterol medication and see if CK resolves. If he has no follow-up, I am happy to see him. thanks

## 2017-12-20 ENCOUNTER — Encounter: Payer: Self-pay | Admitting: Urgent Care

## 2017-12-20 ENCOUNTER — Ambulatory Visit (INDEPENDENT_AMBULATORY_CARE_PROVIDER_SITE_OTHER): Payer: BLUE CROSS/BLUE SHIELD | Admitting: Urgent Care

## 2017-12-20 VITALS — BP 126/78 | HR 64 | Temp 98.3°F | Resp 18 | Ht 70.0 in | Wt 192.8 lb

## 2017-12-20 DIAGNOSIS — Z1159 Encounter for screening for other viral diseases: Secondary | ICD-10-CM

## 2017-12-20 DIAGNOSIS — Z125 Encounter for screening for malignant neoplasm of prostate: Secondary | ICD-10-CM

## 2017-12-20 DIAGNOSIS — J3089 Other allergic rhinitis: Secondary | ICD-10-CM

## 2017-12-20 DIAGNOSIS — I2510 Atherosclerotic heart disease of native coronary artery without angina pectoris: Secondary | ICD-10-CM

## 2017-12-20 DIAGNOSIS — I252 Old myocardial infarction: Secondary | ICD-10-CM

## 2017-12-20 DIAGNOSIS — Z1329 Encounter for screening for other suspected endocrine disorder: Secondary | ICD-10-CM | POA: Diagnosis not present

## 2017-12-20 DIAGNOSIS — Z8739 Personal history of other diseases of the musculoskeletal system and connective tissue: Secondary | ICD-10-CM | POA: Diagnosis not present

## 2017-12-20 DIAGNOSIS — Z0000 Encounter for general adult medical examination without abnormal findings: Secondary | ICD-10-CM | POA: Diagnosis not present

## 2017-12-20 DIAGNOSIS — Z114 Encounter for screening for human immunodeficiency virus [HIV]: Secondary | ICD-10-CM

## 2017-12-20 DIAGNOSIS — M6282 Rhabdomyolysis: Secondary | ICD-10-CM

## 2017-12-20 DIAGNOSIS — E782 Mixed hyperlipidemia: Secondary | ICD-10-CM | POA: Diagnosis not present

## 2017-12-20 DIAGNOSIS — I10 Essential (primary) hypertension: Secondary | ICD-10-CM | POA: Diagnosis not present

## 2017-12-20 MED ORDER — METOPROLOL SUCCINATE ER 25 MG PO TB24: 25.0000 mg | ORAL_TABLET | Freq: Every day | ORAL | 3 refills | Status: DC

## 2017-12-20 MED ORDER — ASPIRIN 81 MG PO CHEW: 81.0000 mg | CHEWABLE_TABLET | Freq: Every day | ORAL | 3 refills | Status: AC

## 2017-12-20 MED ORDER — FLUTICASONE PROPIONATE 50 MCG/ACT NA SUSP: 2.0000 | Freq: Every day | NASAL | 11 refills | Status: DC

## 2017-12-20 MED ORDER — CETIRIZINE HCL 10 MG PO TABS: 10.0000 mg | ORAL_TABLET | Freq: Every day | ORAL | 11 refills | Status: DC

## 2017-12-20 MED ORDER — LISINOPRIL 10 MG PO TABS: 10.0000 mg | ORAL_TABLET | Freq: Every day | ORAL | 3 refills | Status: DC

## 2017-12-20 MED ORDER — CLOPIDOGREL BISULFATE 75 MG PO TABS: 75.0000 mg | ORAL_TABLET | Freq: Every day | ORAL | 3 refills | Status: DC

## 2017-12-20 MED ORDER — ATORVASTATIN CALCIUM 20 MG PO TABS: 20.0000 mg | ORAL_TABLET | Freq: Every day | ORAL | 3 refills | Status: DC

## 2017-12-20 NOTE — Progress Notes (Signed)
MRN: [id number]  Subjective:   [name] is a 57 y.o. male presenting for annual physical exam. Patient works as a Geophysicist/field seismologist, is married. Has good relationships at home, has a good support network. Patient admits unhealthy diet and occasional exercise. He is a English as a second language teacher.  Medical care team includes: PCP: Jaynee Eagles, PA-C Vision: Sees Dr. Sharen Counter annually. Dental: Dental cleanings every 6 months. Specialists: CAD, followed by [facility], is now on 1 year follow up.    Health Maintenance: Colonoscopy completed in 2016, had polyp removed, is not due to repeat colonoscopy until 2021. Will screen for HIV and hepatitis C today.  Abrian has a current medication list which includes the following prescription(s): aspirin, atorvastatin, clopidogrel, lisinopril, metoprolol succinate, and nitroglycerin. He has No Known Allergies.  Troy Anderson  has a past medical history of Hyperlipidemia and Hypertension. Also  has a past surgical history that includes LEFT HEART CATH AND CORONARY ANGIOGRAPHY (N/A, 02/10/2017). His family history includes Cancer in his mother; Colon cancer in his mother; Diabetes in his father; Stomach cancer in his mother.  Immunizations: Will update his flu shot next season but admits that he is not keen on doing this.   Review of Systems  Constitutional: Negative for chills, diaphoresis, fever, malaise/fatigue and weight loss.  HENT: Positive for congestion (intermittent past month) and sore throat (occasional for the past month). Negative for ear discharge, ear pain, hearing loss, nosebleeds and tinnitus.   Eyes: Negative for blurred vision, double vision, photophobia, pain, discharge and redness.  Respiratory: Negative for cough, shortness of breath and wheezing.   Cardiovascular: Negative for chest pain, palpitations and leg swelling.  Gastrointestinal: Negative for abdominal pain, blood in stool, constipation, diarrhea, nausea and vomiting.  Genitourinary: Negative for  dysuria, flank pain, frequency, hematuria and urgency.  Musculoskeletal: Negative for back pain, joint pain and myalgias.  Skin: Negative for itching and rash.  Neurological: Negative for dizziness, tingling, seizures, loss of consciousness, weakness and headaches.  Endo/Heme/Allergies: Negative for polydipsia.  Psychiatric/Behavioral: Negative for depression, hallucinations, memory loss, substance abuse and suicidal ideas. The patient is not nervous/anxious and does not have insomnia.    Objective:   Vitals: BP 126/78   Pulse 64   Temp 98.3 F (36.8 C) (Oral)   Resp 18   Ht 5\' 10"  (1.778 m)   Wt 192 lb 12.8 oz (87.5 kg)   SpO2 97%   BMI 27.66 kg/m   BP Readings from Last 3 Encounters:  12/20/17 126/78  06/28/17 118/78  06/06/17 129/78    Wt Readings from Last 3 Encounters:  12/20/17 192 lb 12.8 oz (87.5 kg)  06/28/17 188 lb (85.3 kg)  06/06/17 191 lb (86.6 kg)    Physical Exam  Constitutional: He is oriented to person, place, and time. He appears well-developed and well-nourished.  HENT:  TM's intact bilaterally, no effusions or erythema. Nasal turbinates boggy and edematous, nasal passages patent. No sinus tenderness. Oropharynx with post-nasal drainage, mucous membranes moist, dentition in good repair.  Eyes: Pupils are equal, round, and reactive to light. Conjunctivae and EOM are normal. Right eye exhibits no discharge. Left eye exhibits no discharge. No scleral icterus.  Neck: Normal range of motion. Neck supple. No thyromegaly present.  Cardiovascular: Normal rate, regular rhythm and intact distal pulses. Exam reveals no gallop and no friction rub.  No murmur heard. Pulmonary/Chest: No stridor. No respiratory distress. He has no wheezes. He has no rales.  Abdominal: Soft. Bowel sounds are normal. He exhibits no distension  and no mass. There is no tenderness.  Musculoskeletal: Normal range of motion. He exhibits no edema or tenderness.  Lymphadenopathy:    He has no  cervical adenopathy.  Neurological: He is alert and oriented to person, place, and time. He has normal reflexes. He displays normal reflexes. Coordination normal.  Skin: Skin is warm and dry. No rash noted. No erythema. No pallor.  Psychiatric: He has a normal mood and affect.   Assessment and Plan :   Annual physical exam  Encounter for hepatitis C screening test for low risk patient - Plan: Hepatitis C antibody  Essential hypertension - Plan: Comprehensive metabolic panel, Microalbumin / creatinine urine ratio  Mixed hyperlipidemia - Plan: Lipid panel  History of myocardial infarction  Coronary artery disease involving native heart without angina pectoris, unspecified vessel or lesion type  History of MI (myocardial infarction)  Screening for thyroid disorder - Plan: TSH  Screening for HIV without presence of risk factors - Plan: HIV antibody  Screening for prostate cancer - Plan: PSA  Non-traumatic rhabdomyolysis - Plan: CK  History of gout - Plan: Uric Acid  Allergic rhinitis due to other allergic trigger, unspecified seasonality  Patient is medically stable, labs pending. Discussed healthy lifestyle, diet, exercise, preventative care, vaccinations, and addressed patient's concerns. All medications refilled. New scripts provided for Zyrtec, Flonase to manage allergic rhinitis. Counseled patient on potential for adverse effects with medications prescribed today, patient verbalized understanding. Return-to-clinic precautions discussed, patient verbalized understanding.   Jaynee Eagles, PA-C Primary Care at [facility] [phone] 12/20/2017  10:27 AM

## 2017-12-20 NOTE — Patient Instructions (Addendum)
Allergic Rhinitis, Adult Allergic rhinitis is an allergic reaction that affects the mucous membrane inside the nose. It causes sneezing, a runny or stuffy nose, and the feeling of mucus going down the back of the throat (postnasal drip). Allergic rhinitis can be mild to severe. There are two types of allergic rhinitis:  Seasonal. This type is also called hay fever. It happens only during certain seasons.  Perennial. This type can happen at any time of the year.  What are the causes? This condition happens when the body's defense system (immune system) responds to certain harmless substances called allergens as though they were germs.  Seasonal allergic rhinitis is triggered by pollen, which can come from grasses, trees, and weeds. Perennial allergic rhinitis may be caused by:  House dust mites.  Pet dander.  Mold spores.  What are the signs or symptoms? Symptoms of this condition include:  Sneezing.  Runny or stuffy nose (nasal congestion).  Postnasal drip.  Itchy nose.  Tearing of the eyes.  Trouble sleeping.  Daytime sleepiness.  How is this diagnosed? This condition may be diagnosed based on:  Your medical history.  A physical exam.  Tests to check for related conditions, such as: ? Asthma. ? Pink eye. ? Ear infection. ? Upper respiratory infection.  Tests to find out which allergens trigger your symptoms. These may include skin or blood tests.  How is this treated? There is no cure for this condition, but treatment can help control symptoms. Treatment may include:  Taking medicines that block allergy symptoms, such as antihistamines. Medicine may be given as a shot, nasal spray, or pill.  Avoiding the allergen.  Desensitization. This treatment involves getting ongoing shots until your body becomes less sensitive to the allergen. This treatment may be done if other treatments do not help.  If taking medicine and avoiding the allergen does not work, new,  stronger medicines may be prescribed.  Follow these instructions at home:  Find out what you are allergic to. Common allergens include smoke, dust, and pollen.  Avoid the things you are allergic to. These are some things you can do to help avoid allergens: ? Replace carpet with wood, tile, or vinyl flooring. Carpet can trap dander and dust. ? Do not smoke. Do not allow smoking in your home. ? Change your heating and air conditioning filter at least once a month. ? During allergy season:  Keep windows closed as much as possible.  Plan outdoor activities when pollen counts are lowest. This is usually during the evening hours.  When coming indoors, change clothing and shower before sitting on furniture or bedding.  Take over-the-counter and prescription medicines only as told by your health care provider.  Keep all follow-up visits as told by your health care provider. This is important. Contact a health care provider if:  You have a fever.  You develop a persistent cough.  You make whistling sounds when you breathe (you wheeze).  Your symptoms interfere with your normal daily activities. Get help right away if:  You have shortness of breath. Summary  This condition can be managed by taking medicines as directed and avoiding allergens.  Contact your health care provider if you develop a persistent cough or fever.  During allergy season, keep windows closed as much as possible. This information is not intended to replace advice given to you by your health care provider. Make sure you discuss any questions you have with your health care provider. Document Released: 06/08/2001 Document Revised: 10/21/2016  Document Reviewed: 10/21/2016 Elsevier Interactive Patient Education  2018 [place] Maintenance, Male A healthy lifestyle and preventive care is important for your health and wellness. Ask your health care provider about what schedule of regular  examinations is right for you. What should I know about weight and diet? Eat a Healthy Diet  Eat plenty of vegetables, fruits, whole grains, low-fat dairy products, and lean protein.  Do not eat a lot of foods high in solid fats, added sugars, or salt.  Maintain a Healthy Weight Regular exercise can help you achieve or maintain a healthy weight. You should:  Do at least 150 minutes of exercise each week. The exercise should increase your heart rate and make you sweat (moderate-intensity exercise).  Do strength-training exercises at least twice a week.  Watch Your Levels of Cholesterol and Blood Lipids  Have your blood tested for lipids and cholesterol every 5 years starting at 55 years of age. If you are at high risk for heart disease, you should start having your blood tested when you are 55 years old. You may need to have your cholesterol levels checked more often if: ? Your lipid or cholesterol levels are high. ? You are older than 55 years of age. ? You are at high risk for heart disease.  What should I know about cancer screening? Many types of cancers can be detected early and may often be prevented. Lung Cancer  You should be screened every year for lung cancer if: ? You are a current smoker who has smoked for at least 30 years. ? You are a former smoker who has quit within the past 15 years.  Talk to your health care provider about your screening options, when you should start screening, and how often you should be screened.  Colorectal Cancer  Routine colorectal cancer screening usually begins at 55 years of age and should be repeated every 5-10 years until you are 55 years old. You may need to be screened more often if early forms of precancerous polyps or small growths are found. Your health care provider may recommend screening at an earlier age if you have risk factors for colon cancer.  Your health care provider may recommend using home test kits to check for hidden  blood in the stool.  A small camera at the end of a tube can be used to examine your colon (sigmoidoscopy or colonoscopy). This checks for the earliest forms of colorectal cancer.  Prostate and Testicular Cancer  Depending on your age and overall health, your health care provider may do certain tests to screen for prostate and testicular cancer.  Talk to your health care provider about any symptoms or concerns you have about testicular or prostate cancer.  Skin Cancer  Check your skin from head to toe regularly.  Tell your health care provider about any new moles or changes in moles, especially if: ? There is a change in a mole's size, shape, or color. ? You have a mole that is larger than a pencil eraser.  Always use sunscreen. Apply sunscreen liberally and repeat throughout the day.  Protect yourself by wearing long sleeves, pants, a wide-brimmed hat, and sunglasses when outside.  What should I know about heart disease, diabetes, and high blood pressure?  If you are 38-92 years of age, have your blood pressure checked every 3-5 years. If you are 63 years of age or older, have your blood pressure checked every year. You should have  your blood pressure measured twice-once when you are at a hospital or clinic, and once when you are not at a hospital or clinic. Record the average of the two measurements. To check your blood pressure when you are not at a hospital or clinic, you can use: ? An automated blood pressure machine at a pharmacy. ? A home blood pressure monitor.  Talk to your health care provider about your target blood pressure.  If you are between 71-17 years old, ask your health care provider if you should take aspirin to prevent heart disease.  Have regular diabetes screenings by checking your fasting blood sugar level. ? If you are at a normal weight and have a low risk for diabetes, have this test once every three years after the age of 26. ? If you are overweight and  have a high risk for diabetes, consider being tested at a younger age or more often.  A one-time screening for abdominal aortic aneurysm (AAA) by ultrasound is recommended for men aged 55-75 years who are current or former smokers. What should I know about preventing infection? Hepatitis B If you have a higher risk for hepatitis B, you should be screened for this virus. Talk with your health care provider to find out if you are at risk for hepatitis B infection. Hepatitis C Blood testing is recommended for:  Everyone born from [birth year] through 1963.  Anyone with known risk factors for hepatitis C.  Sexually Transmitted Diseases (STDs)  You should be screened each year for STDs including gonorrhea and chlamydia if: ? You are sexually active and are younger than 55 years of age. ? You are older than 55 years of age and your health care provider tells you that you are at risk for this type of infection. ? Your sexual activity has changed since you were last screened and you are at an increased risk for chlamydia or gonorrhea. Ask your health care provider if you are at risk.  Talk with your health care provider about whether you are at high risk of being infected with HIV. Your health care provider may recommend a prescription medicine to help prevent HIV infection.  What else can I do?  Schedule regular health, dental, and eye exams.  Stay current with your vaccines (immunizations).  Do not use any tobacco products, such as cigarettes, chewing tobacco, and e-cigarettes. If you need help quitting, ask your health care provider.  Limit alcohol intake to no more than 2 drinks per day. One drink equals 12 ounces of beer, 5 ounces of wine, or 1 ounces of hard liquor.  Do not use street drugs.  Do not share needles.  Ask your health care provider for help if you need support or information about quitting drugs.  Tell your health care provider if you often feel depressed.  Tell your  health care provider if you have ever been abused or do not feel safe at home. This information is not intended to replace advice given to you by your health care provider. Make sure you discuss any questions you have with your health care provider. Document Released: 03/11/2008 Document Revised: 05/12/2016 Document Reviewed: 06/17/2015 Elsevier Interactive Patient Education  2018 [employer].     IF you received an x-ray today, you will receive an invoice from [facility]. Please contact [facility] at [phone] with questions or concerns regarding your invoice.   IF you received labwork today, you will receive an invoice from [facility]. Please contact [facility] at  [phone] with questions or concerns regarding your invoice.   Our billing staff will not be able to assist you with questions regarding bills from these companies.  You will be contacted with the lab results as soon as they are available. The fastest way to get your results is to activate your My Chart account. Instructions are located on the last page of this paperwork. If you have not heard from [place] regarding the results in 2 weeks, please contact this office.

## 2017-12-21 LAB — CK: CK TOTAL: 483 U/L — AB (ref 24–204)

## 2017-12-21 LAB — COMPREHENSIVE METABOLIC PANEL
A/G RATIO: 2 (ref 1.2–2.2)
ALK PHOS: 33 IU/L — AB (ref 39–117)
ALT: 47 IU/L — AB (ref 0–44)
AST: 32 IU/L (ref 0–40)
Albumin: 4.7 g/dL (ref 3.5–5.5)
BILIRUBIN TOTAL: 0.4 mg/dL (ref 0.0–1.2)
BUN/Creatinine Ratio: 9 (ref 9–20)
BUN: 9 mg/dL (ref 6–24)
CALCIUM: 9.8 mg/dL (ref 8.7–10.2)
CHLORIDE: 98 mmol/L (ref 96–106)
CO2: 25 mmol/L (ref 20–29)
Creatinine, Ser: 0.96 mg/dL (ref 0.76–1.27)
GFR calc Af Amer: 103 mL/min/{1.73_m2} (ref >59)
GFR calc non Af Amer: 89 mL/min/{1.73_m2} (ref >59)
GLOBULIN, TOTAL: 2.4 g/dL (ref 1.5–4.5)
Glucose: 89 mg/dL (ref 65–99)
POTASSIUM: 4.6 mmol/L (ref 3.5–5.2)
SODIUM: 138 mmol/L (ref 134–144)
Total Protein: 7.1 g/dL (ref 6.0–8.5)

## 2017-12-21 LAB — LIPID PANEL
CHOL/HDL RATIO: 3.5 ratio (ref 0.0–5.0)
CHOLESTEROL TOTAL: 165 mg/dL (ref 100–199)
HDL: 47 mg/dL (ref >39)
LDL Calculated: 73 mg/dL (ref 0–99)
TRIGLYCERIDES: 226 mg/dL — AB (ref 0–149)
VLDL Cholesterol Cal: 45 mg/dL — ABNORMAL HIGH (ref 5–40)

## 2017-12-21 LAB — URIC ACID: Uric Acid: 6.3 mg/dL (ref 3.7–8.6)

## 2017-12-21 LAB — MICROALBUMIN / CREATININE URINE RATIO
Creatinine, Urine: 107.8 mg/dL
MICROALB/CREAT RATIO: 2.9 mg/g{creat} (ref 0.0–30.0)
MICROALBUM., U, RANDOM: 3.1 ug/mL

## 2017-12-21 LAB — TSH: TSH: 2.22 u[IU]/mL (ref 0.450–4.500)

## 2017-12-21 LAB — HIV ANTIBODY (ROUTINE TESTING W REFLEX): HIV SCREEN 4TH GENERATION: NONREACTIVE

## 2017-12-21 LAB — HEPATITIS C ANTIBODY

## 2017-12-21 LAB — PSA: Prostate Specific Ag, Serum: 0.9 ng/mL (ref 0.0–4.0)

## 2017-12-27 ENCOUNTER — Telehealth: Payer: Self-pay | Admitting: Urgent Care

## 2017-12-27 NOTE — Telephone Encounter (Signed)
Please advise 

## 2017-12-27 NOTE — Telephone Encounter (Signed)
Copied from [place] [id number]. Topic: Quick Communication - See Telephone Encounter >> Dec 27, 2017  9:16 AM Troy Anderson wrote: CRM for notification. See Telephone encounter for: 12/27/17. PT is calling for lab results and also wants them posted on mychart for comparison .  He also wants to know how long is he suppose to take the flonase and zyrtec

## 2017-12-29 NOTE — Telephone Encounter (Signed)
Patient called, refer to result note from 12/29/2017.

## 2018-02-20 ENCOUNTER — Encounter: Payer: Self-pay | Admitting: Family Medicine

## 2018-05-22 ENCOUNTER — Telehealth: Payer: Self-pay | Admitting: Urgent Care

## 2018-05-22 NOTE — Telephone Encounter (Signed)
Copied from [place] [id number]. Topic: Quick Communication - See Telephone Encounter >> May 22, 2018 11:54 AM Marja Kays F wrote: Pt is needing to get orders put in for doing lipid panel lab work   Best number [phone]

## 2018-05-24 NOTE — Telephone Encounter (Signed)
Patient is requesting lab work prior to appt so results will be available at appt. Please advise

## 2018-05-24 NOTE — Telephone Encounter (Signed)
Left message patient is due for a 6 month follow up.  Needs to schedule an appointment

## 2018-05-25 NOTE — Telephone Encounter (Signed)
Left message  Patient will need to schedule appointment.  Lab work can only be done and put in a week before appointment. There is no appointment scheduled.

## 2018-06-13 ENCOUNTER — Ambulatory Visit (INDEPENDENT_AMBULATORY_CARE_PROVIDER_SITE_OTHER): Payer: BLUE CROSS/BLUE SHIELD | Admitting: Urgent Care

## 2018-06-13 ENCOUNTER — Other Ambulatory Visit: Payer: Self-pay

## 2018-06-13 ENCOUNTER — Encounter: Payer: Self-pay | Admitting: Urgent Care

## 2018-06-13 VITALS — BP 128/81 | HR 60 | Temp 97.6°F | Ht 70.0 in | Wt 183.4 lb

## 2018-06-13 DIAGNOSIS — I10 Essential (primary) hypertension: Secondary | ICD-10-CM

## 2018-06-13 DIAGNOSIS — Z8739 Personal history of other diseases of the musculoskeletal system and connective tissue: Secondary | ICD-10-CM

## 2018-06-13 DIAGNOSIS — I252 Old myocardial infarction: Secondary | ICD-10-CM | POA: Diagnosis not present

## 2018-06-13 DIAGNOSIS — I2510 Atherosclerotic heart disease of native coronary artery without angina pectoris: Secondary | ICD-10-CM | POA: Diagnosis not present

## 2018-06-13 DIAGNOSIS — E782 Mixed hyperlipidemia: Secondary | ICD-10-CM

## 2018-06-13 DIAGNOSIS — M791 Myalgia, unspecified site: Secondary | ICD-10-CM

## 2018-06-13 MED ORDER — CLOPIDOGREL BISULFATE 75 MG PO TABS: 75.0000 mg | ORAL_TABLET | Freq: Every day | ORAL | 3 refills | Status: AC

## 2018-06-13 MED ORDER — CETIRIZINE HCL 10 MG PO TABS: 10.0000 mg | ORAL_TABLET | Freq: Every day | ORAL | 3 refills | Status: AC

## 2018-06-13 MED ORDER — LISINOPRIL 10 MG PO TABS: 10.0000 mg | ORAL_TABLET | Freq: Every day | ORAL | 3 refills | Status: AC

## 2018-06-13 MED ORDER — ATORVASTATIN CALCIUM 20 MG PO TABS
20.0000 mg | ORAL_TABLET | Freq: Every day | ORAL | 3 refills | Status: DC
Start: 2018-06-13 — End: 2020-03-21

## 2018-06-13 MED ORDER — METOPROLOL SUCCINATE ER 25 MG PO TB24: 25.0000 mg | ORAL_TABLET | Freq: Every day | ORAL | 3 refills | Status: AC

## 2018-06-13 NOTE — Progress Notes (Signed)
   MRN: [id number] DOB: [birth year]  Subjective:   Troy Anderson is a 57 y.o. male presenting for medication refills and repeat labs.  Patient has a history of CAD, MI, hypertension, hyperlipidemia, gout, myalgia.  Troy Anderson reports that his only complaint has been having heartburn that resolves on its own.  Admits that his diet does consist of a lot of GERD causing foods including spicy foods, [place] food, some fried foods.  Troy Anderson does not take any medications for this.  Has not needed to use nitroglycerin at all.  Troy Anderson does have cardiology follow-up at the end of this year or January 2020.  reports that Troy Anderson has never smoked. Troy Anderson has never used smokeless tobacco. Troy Anderson reports that Troy Anderson drinks alcohol. Troy Anderson reports that Troy Anderson does not use drugs.   Troy Anderson has a current medication list which includes the following prescription(s): aspirin, atorvastatin, cetirizine, clopidogrel, fluticasone, lisinopril, metoprolol succinate, and nitroglycerin. Also has No Known Allergies.  Troy Anderson  has a past medical history of Hyperlipidemia and Hypertension. Also  has a past surgical history that includes LEFT HEART CATH AND CORONARY ANGIOGRAPHY (N/A, 02/10/2017).  Objective:   Vitals: BP 128/81 (BP Location: Right Arm, Patient Position: Sitting, Cuff Size: Normal)   Pulse 60   Temp 97.6 F (36.4 C) (Oral)   Ht 5\' 10"  (1.778 m)   Wt 183 lb 6.4 oz (83.2 kg)   SpO2 98%   BMI 26.32 kg/m   Physical Exam  Constitutional: Troy Anderson is oriented to person, place, and time. Troy Anderson appears well-developed and well-nourished.  Cardiovascular: Normal rate, regular rhythm, normal heart sounds and intact distal pulses. Exam reveals no gallop and no friction rub.  No murmur heard. Pulmonary/Chest: Effort normal and breath sounds normal. No stridor. No respiratory distress. Troy Anderson has no wheezes. Troy Anderson has no rales.  Neurological: Troy Anderson is alert and oriented to person, place, and time.   Assessment and Plan :   Essential hypertension - Plan: Comprehensive metabolic  panel  Mixed hyperlipidemia - Plan: Lipid panel  History of myocardial infarction  Coronary artery disease involving native heart without angina pectoris, unspecified vessel or lesion type  History of MI (myocardial infarction)  History of gout - Plan: Uric Acid  Myalgia - Plan: CK  Labs pending, patient is medically stable.  Refills provided.  Patient is to maintain follow-up with cardiology.  Follow-up in 6 months to 1 year.  Jaynee Eagles, PA-C Primary Care at [facility] [phone] 06/13/2018  10:44 AM

## 2018-06-13 NOTE — Patient Instructions (Addendum)
Coronary Artery Disease, Male  Coronary artery disease (CAD) is a condition in which the arteries that lead to the heart (coronary arteries) become narrow or blocked. The narrowing or blockage can lead to decreased blood flow to the heart. Prolonged reduced blood flow can cause a heart attack (myocardial infarction or MI). This condition may also be called coronary heart disease.  Because CAD is the leading cause of death in men, it is important to understand what causes this condition and how it is treated.  What are the causes?  CAD is most often caused by atherosclerosis. This is the buildup of fat and cholesterol (plaque) on the inside of the arteries. Over time, the plaque may narrow or block the artery, reducing blood flow to the heart. Plaque can also become weak and break off within a coronary artery and cause a sudden blockage. Other less common causes of CAD include:   An embolism or blood clot in a coronary artery.   A tearing of the artery (spontaneous coronary artery dissection).   An aneurysm.   Inflammation (vasculitis) in the artery wall.    What increases the risk?  The following factors may make you more likely to develop this condition:   Age. Men over age 45 are at a greater risk of CAD.   Family history of CAD.   Gender. Men often develop CAD earlier in life than women.   High blood pressure (hypertension).   Diabetes.   High cholesterol levels.   Tobacco use.   Excessive alcohol use.   Lack of exercise.   A diet high in saturated and trans fats, such as fried food and processed meat.    Other possible risk factors include:   High stress levels.   Depression.   Obesity.   Sleep apnea.    What are the signs or symptoms?  Many people do not have any symptoms during the early stages of CAD. As the condition progresses, symptoms may include:   Chest pain (angina). The pain can:  ? Feel like a crushing or squeezing, or a tightness, pressure, fullness, or heaviness in the  chest.  ? Last more than a few minutes or can stop and recur. The pain tends to get worse with exercise or stress and to fade with rest.   Pain in the arms, neck, jaw, or back.   Unexplained heartburn or indigestion.   Shortness of breath.   Nausea or vomiting.   Sudden light-headedness.   Sudden cold sweats.   Fluttering or fast heartbeat (palpitations).    How is this diagnosed?  This condition is diagnosed based on:   Your family and medical history.   A physical exam.   Tests, including:  ? A test to check the electrical signals in your heart (electrocardiogram).  ? Exercise stress test. This looks for signs of blockage when the heart is stressed with exercise, such as running on a treadmill.  ? Pharmacologic stress test. This test looks for signs of blockage when the heart is being stressed with a medicine.  ? Blood tests.  ? Coronary angiogram. This is a procedure to look at the coronary arteries to see if there is any blockage. During this test, a dye is injected into your arteries so they appear on an X-ray.  ? A test that uses sound waves to take a picture of your heart (echocardiogram).  ? Chest X-ray.    How is this treated?  This condition may be treated by:     Healthy lifestyle changes to reduce risk factors.   Medicines such as:  ? Antiplatelet medicines and blood-thinning medicines, such as aspirin. These help to prevent blood clots.  ? Nitroglycerin.  ? Blood pressure medicines.  ? Cholesterol-lowering medicine.   Coronary angioplasty and stenting. During this procedure, a thin, flexible tube is inserted through a blood vessel and into a blocked artery. A balloon or similar device on the end of the tube is inflated to open up the artery. In some cases, a small, mesh tube (stent) is inserted into the artery to keep it open.   Coronary artery bypass surgery. During this surgery, veins or arteries from other parts of the body are used to create a bypass around the blockage and allow blood  to reach your heart.    Follow these instructions at home:  Medicines   Take over-the-counter and prescription medicines only as told by your health care provider.   Do not take the following medicines unless your health care provider approves:  ? NSAIDs, such as ibuprofen, naproxen, or celecoxib.  ? Vitamin supplements that contain vitamin A, vitamin E, or both.  Lifestyle   Follow an exercise program approved by your health care provider. Aim for 150 minutes of moderate exercise or 75 minutes of vigorous exercise each week.   Maintain a healthy weight or lose weight as approved by your health care provider.   Rest when you are tired.   Learn to manage stress or try to limit your stress. Ask your health care provider for suggestions if you need help.   Get screened for depression and seek treatment, if needed.   Do not use any products that contain nicotine or tobacco, such as cigarettes and e-cigarettes. If you need help quitting, ask your health care provider.   Do not use illegal drugs.  Eating and drinking   Follow a heart-healthy diet. A dietitian can help educate you about healthy food options and changes. In general, eat plenty of fruits and vegetables, lean meats, and whole grains.   Avoid foods high in:  ? Sugar.  ? Salt (sodium).  ? Saturated fat, such as processed or fatty meat.  ? Trans fat, such as fried foods.   Use healthy cooking methods such as roasting, grilling, broiling, baking, poaching, steaming, or stir-frying.   If you drink alcohol, and your health care provider approves, limit your alcohol intake to no more than 2 drinks per day. One drink equals 12 ounces of beer, 5 ounces of wine, or 1 ounces of hard liquor.  General instructions   Manage any other health conditions, such as hypertension and diabetes. These conditions affect your heart.   Your health care provider may ask you to monitor your blood pressure. Ideally, your blood pressure should be below 130/80.   Keep all  follow-up visits as told by your health care provider. This is important.  Get help right away if:   You have pain in your chest, neck, arm, jaw, stomach, or back that:  ? Lasts more than a few minutes.  ? Is recurring.  ? Is not relieved by taking medicine under your tongue (sublingualnitroglycerin).   You have too much (profuse) sweating without cause.   You have unexplained:  ? Heartburn or indigestion.  ? Shortness of breath or difficulty breathing.  ? Fluttering or fast heartbeat (palpitations).  ? Nausea or vomiting.  ? Fatigue.  ? Feelings of nervousness or anxiety.  ? Weakness.  ? Diarrhea.   You   the early stages of CAD. This is called "silent CAD."  CAD can be treated with lifestyle changes, medicines, surgery, or a combination of these treatments. This information is not intended to replace advice given to you by your health care provider. Make sure you discuss any questions you have with your health care provider. Document Released: 04/10/2014 Document Revised: 09/03/2016 Document Reviewed: 09/03/2016 Elsevier Interactive Patient Education  [employer].     If you have lab work done today you will be contacted with your lab results within the next 2 weeks.  If you have not heard from [place] then please contact us. The fastest way to get your results is to register for My  Chart.   IF you received an x-ray today, you will receive an invoice from [facility]. Please contact [facility] at [phone] with questions or concerns regarding your invoice.   IF you received labwork today, you will receive an invoice from [facility]. Please contact [facility] at [phone] with questions or concerns regarding your invoice.   Our billing staff will not be able to assist you with questions regarding bills from these companies.  You will be contacted with the lab results as soon as they are available. The fastest way to get your results is to activate your My Chart account. Instructions are located on the last page of this paperwork. If you have not heard from [place] regarding the results in 2 weeks, please contact this office.

## 2018-06-14 LAB — COMPREHENSIVE METABOLIC PANEL
A/G RATIO: 2 (ref 1.2–2.2)
ALT: 34 IU/L (ref 0–44)
AST: 29 IU/L (ref 0–40)
Albumin: 4.9 g/dL (ref 3.5–5.5)
Alkaline Phosphatase: 35 IU/L — ABNORMAL LOW (ref 39–117)
BUN/Creatinine Ratio: 14 (ref 9–20)
BUN: 15 mg/dL (ref 6–24)
Bilirubin Total: 0.6 mg/dL (ref 0.0–1.2)
CALCIUM: 10.4 mg/dL — AB (ref 8.7–10.2)
CO2: 23 mmol/L (ref 20–29)
CREATININE: 1.06 mg/dL (ref 0.76–1.27)
Chloride: 98 mmol/L (ref 96–106)
GFR, EST AFRICAN AMERICAN: 91 mL/min/{1.73_m2} (ref >59)
GFR, EST NON AFRICAN AMERICAN: 79 mL/min/{1.73_m2} (ref >59)
GLOBULIN, TOTAL: 2.5 g/dL (ref 1.5–4.5)
Glucose: 88 mg/dL (ref 65–99)
Potassium: 4.5 mmol/L (ref 3.5–5.2)
Sodium: 138 mmol/L (ref 134–144)
TOTAL PROTEIN: 7.4 g/dL (ref 6.0–8.5)

## 2018-06-14 LAB — CK: Total CK: 1131 U/L (ref 24–204)

## 2018-06-14 LAB — LIPID PANEL
CHOL/HDL RATIO: 3 ratio (ref 0.0–5.0)
Cholesterol, Total: 138 mg/dL (ref 100–199)
HDL: 46 mg/dL (ref >39)
LDL CALC: 64 mg/dL (ref 0–99)
TRIGLYCERIDES: 142 mg/dL (ref 0–149)
VLDL CHOLESTEROL CAL: 28 mg/dL (ref 5–40)

## 2018-06-14 LAB — URIC ACID: URIC ACID: 7 mg/dL (ref 3.7–8.6)

## 2018-06-20 ENCOUNTER — Ambulatory Visit (INDEPENDENT_AMBULATORY_CARE_PROVIDER_SITE_OTHER): Payer: BLUE CROSS/BLUE SHIELD | Admitting: Urgent Care

## 2018-06-20 DIAGNOSIS — R748 Abnormal levels of other serum enzymes: Secondary | ICD-10-CM

## 2018-06-21 LAB — CK: Total CK: 680 U/L (ref 24–204)

## 2018-06-23 ENCOUNTER — Encounter: Payer: Self-pay | Admitting: Urgent Care

## 2018-06-23 ENCOUNTER — Telehealth: Payer: Self-pay | Admitting: Urgent Care

## 2018-06-23 ENCOUNTER — Other Ambulatory Visit: Payer: Self-pay | Admitting: Urgent Care

## 2018-06-23 DIAGNOSIS — R748 Abnormal levels of other serum enzymes: Secondary | ICD-10-CM

## 2018-06-23 NOTE — Telephone Encounter (Signed)
Please let the patient know that his CK level decreased by 50%. He should remain off the lipitor and follow up with an office visit.

## 2018-06-23 NOTE — Telephone Encounter (Signed)
Copied from [place] [id number]. Topic: Quick Communication - See Telephone Encounter >> Jun 23, 2018 12:53 PM Blase Mess A wrote: CRM for notification. See Telephone encounter for: 06/23/18. Patient is calling for lab results.  He is aware that today is dr Wilburn Cornelia last day. Please advise

## 2018-06-26 NOTE — Telephone Encounter (Signed)
I have called the pt and informed him the message from the provider. He stated that he has an appointment with cards in Dec.   FYI to provider.

## 2018-11-20 DIAGNOSIS — E782 Mixed hyperlipidemia: Secondary | ICD-10-CM | POA: Insufficient documentation

## 2018-11-30 DIAGNOSIS — I10 Essential (primary) hypertension: Secondary | ICD-10-CM | POA: Insufficient documentation

## 2018-12-03 DIAGNOSIS — R748 Abnormal levels of other serum enzymes: Secondary | ICD-10-CM | POA: Insufficient documentation

## 2018-12-16 ENCOUNTER — Other Ambulatory Visit: Payer: Self-pay

## 2018-12-16 DIAGNOSIS — Z0000 Encounter for general adult medical examination without abnormal findings: Secondary | ICD-10-CM

## 2018-12-16 DIAGNOSIS — E782 Mixed hyperlipidemia: Secondary | ICD-10-CM

## 2018-12-18 ENCOUNTER — Telehealth: Payer: Self-pay | Admitting: Family Medicine

## 2018-12-18 NOTE — Telephone Encounter (Addendum)
12/18/2018 - PATIENT HAS AN APPOINTMENT FOR AN ANNUAL PHYSICAL WITH DR. Carlota Raspberry ON Friday 12/22/2018. HE IS A FORMER [facility] PATIENT AND HAS NEVER SEEN DR. Carlota Raspberry BEFORE. I CALLED HIM TO RESCHEDULE A TELEMED PE VISIT WITH ONE OF THE PROVIDERS THAT ARE EXCEPTING NEW PATIENTS. NO LABS ARE NEEDED. JUST WAITING FOR HIM TO CALL BACK SO WE CAN SEE WHICH MD WE WILL PUT HIM WITH. HE HAS SEEN DR. Benay Spice [place] BEFORE. [place]

## 2018-12-22 ENCOUNTER — Encounter: Payer: Self-pay | Admitting: Family Medicine

## 2019-02-17 IMAGING — DX DG CHEST 1V PORT
1 series · 1 of 1 positions shown · non-contrast
Comparison: None.

CLINICAL DATA: Chest pain

EXAM:
PORTABLE CHEST 1 VIEW

[chest ap]
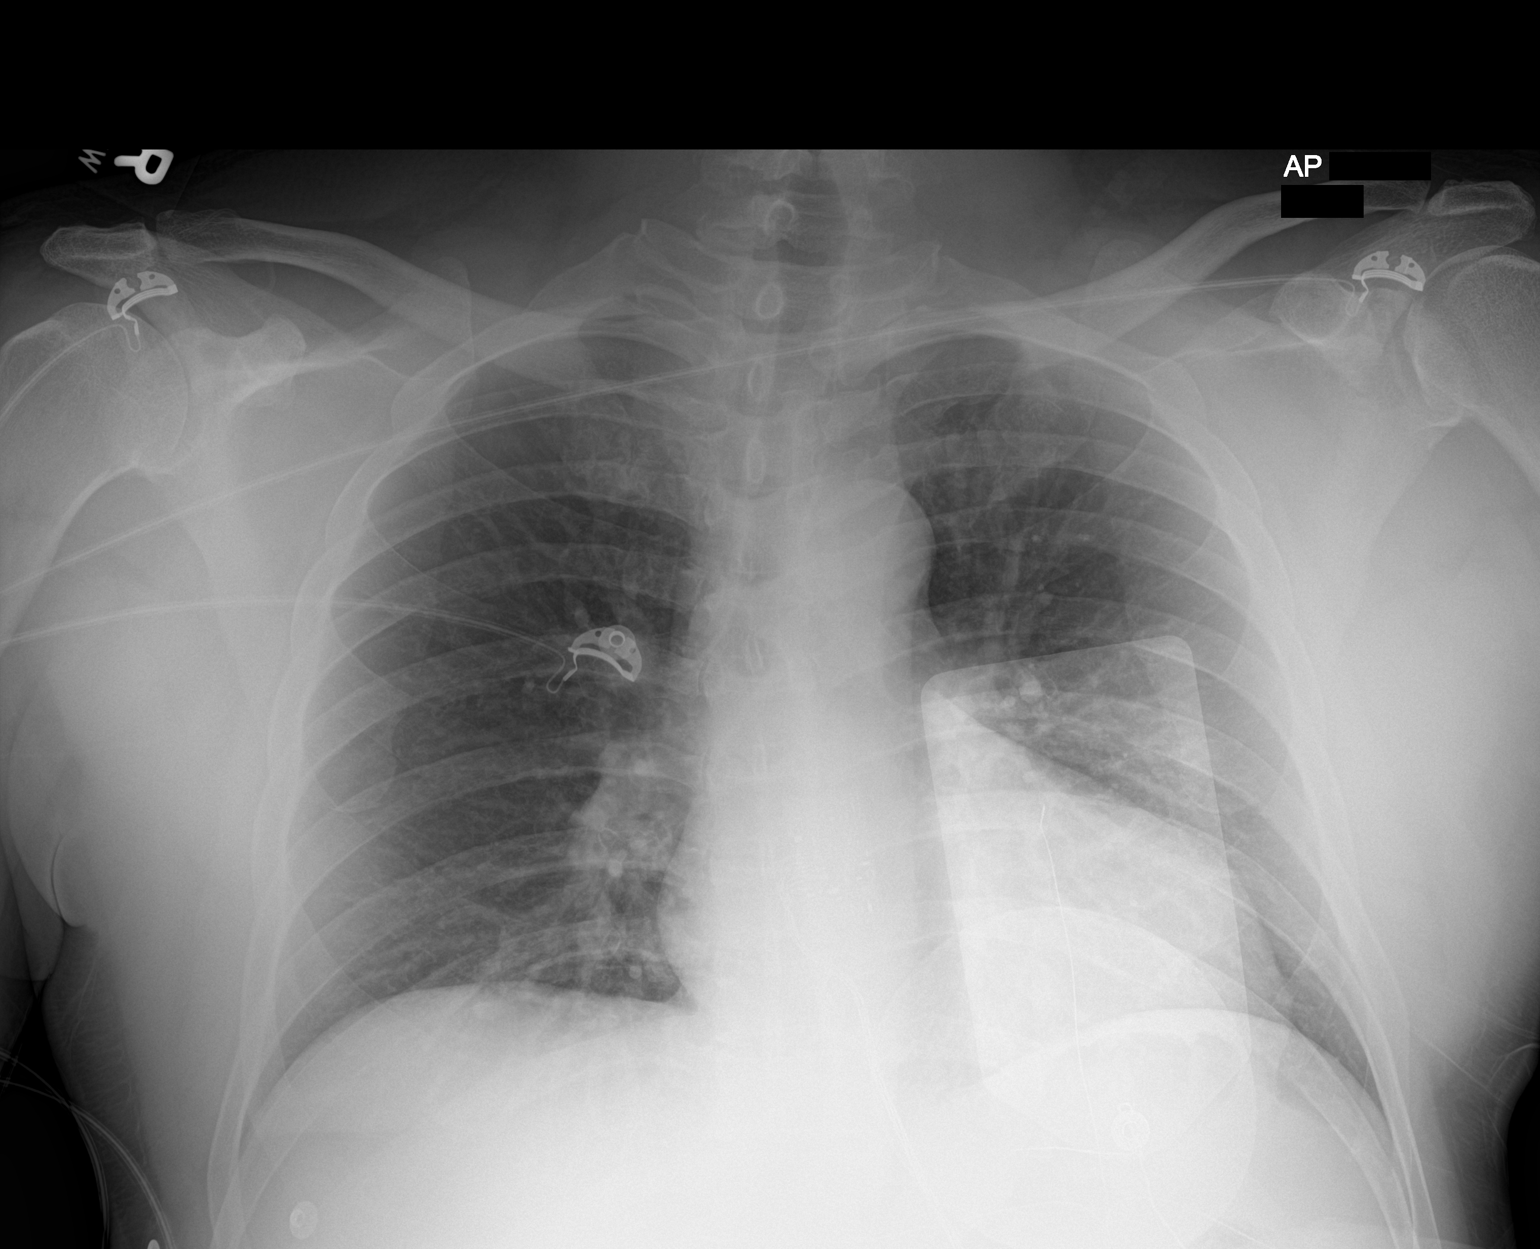

[1 of 1 positions shown; findings below may reference images not displayed]

FINDINGS: The heart size and mediastinal contours are within normal limits.
Both lungs are clear. The visualized skeletal structures are
unremarkable.
IMPRESSION: No active disease.

## 2019-07-15 DIAGNOSIS — I2510 Atherosclerotic heart disease of native coronary artery without angina pectoris: Secondary | ICD-10-CM | POA: Insufficient documentation

## 2019-12-06 ENCOUNTER — Ambulatory Visit: Payer: BC Managed Care – PPO | Attending: Internal Medicine

## 2019-12-06 DIAGNOSIS — Z23 Encounter for immunization: Secondary | ICD-10-CM

## 2019-12-06 NOTE — Progress Notes (Signed)
   Covid-19 Vaccination Clinic  Name:  Troy Anderson    MRN: [id number] DOB: 1961-01-07  12/06/2019  [name] was observed post Covid-19 immunization for 15 minutes without incident. He was provided with Vaccine Information Sheet and instruction to access the V-Safe system.   [name] was instructed to call 911 with any severe reactions post vaccine: Marland Kitchen Difficulty breathing  . Swelling of face and throat  . A fast heartbeat  . A bad rash all over body  . Dizziness and weakness   Immunizations Administered    Name Date Dose VIS Date Route   Pfizer COVID-19 Vaccine 12/06/2019 11:28 AM 0.3 mL 09/07/2019 Intramuscular   Manufacturer: [place]   Lot: [id number]   [place]: [id number]

## 2020-01-02 ENCOUNTER — Ambulatory Visit: Payer: BC Managed Care – PPO | Attending: Internal Medicine

## 2020-01-02 DIAGNOSIS — Z23 Encounter for immunization: Secondary | ICD-10-CM

## 2020-01-02 NOTE — Progress Notes (Signed)
   Covid-19 Vaccination Clinic  Name:  Satyam Cizek    MRN: [id number] DOB: Jun 28, 1961  01/02/2020  Troy Anderson was observed post Covid-19 immunization for 15 minutes without incident. He was provided with Vaccine Information Sheet and instruction to access the V-Safe system.   Mr. Beerman was instructed to call 911 with any severe reactions post vaccine: Marland Kitchen Difficulty breathing  . Swelling of face and throat  . A fast heartbeat  . A bad rash all over body  . Dizziness and weakness   Immunizations Administered    Name Date Dose VIS Date Route   Pfizer COVID-19 Vaccine 01/02/2020 10:02 AM 0.3 mL 09/07/2019 Intramuscular   Manufacturer: [place]   Lot: [id number] [id number]   [place]: [id number]

## 2020-01-16 ENCOUNTER — Encounter: Payer: Self-pay | Admitting: Gastroenterology

## 2020-03-21 ENCOUNTER — Encounter: Payer: Self-pay | Admitting: Gastroenterology

## 2020-03-21 ENCOUNTER — Ambulatory Visit (INDEPENDENT_AMBULATORY_CARE_PROVIDER_SITE_OTHER): Payer: BC Managed Care – PPO | Admitting: Gastroenterology

## 2020-03-21 VITALS — BP 110/76 | HR 75 | Ht 70.0 in | Wt 195.5 lb

## 2020-03-21 DIAGNOSIS — Z86010 Personal history of colonic polyps: Secondary | ICD-10-CM | POA: Diagnosis not present

## 2020-03-21 NOTE — Progress Notes (Signed)
HPI: This is a very pleasant 59 year old man whom I last saw the time of a colonoscopy in 2016.  At that time I have performed a colonoscopy for him for family history of colon cancer.  I found 3 subcentimeter polyps.  1 was an adenoma, one was a sessile serrated polyp, one was a hyperplastic polyp I recommended repeat colonoscopy at 5-year interval.  Since then he has had an acute MI.  2018 he had an angiogram and right coronary artery was stented with a drug-eluting stent.  He is on Plavix since then.  Today he is not really sure if his mom actually had colon cancer but he knows that she did eventually have gastric cancer.  He has no troubles with his bowels.  Specifically no bleeding, constipation, diarrhea or significant abdominal pains.  His weight is overall stable.  Review of systems: Pertinent positive and negative review of systems were noted in the above HPI section. All other review negative.   Past Medical History:  Diagnosis Date   Heart attack ([place]) 02/11/2017   Hyperlipidemia    Hypertension     Past Surgical History:  Procedure Laterality Date   LEFT HEART CATH AND CORONARY ANGIOGRAPHY N/A 02/10/2017   Procedure: Left Heart Cath and Coronary Angiography;  Surgeon: Yolonda Kida, MD;  Location: [facility];  Service: Cardiovascular;  Laterality: N/A;    Current Outpatient Medications  Medication Sig Dispense Refill   aspirin 81 MG chewable tablet Chew 1 tablet (81 mg total) by mouth daily. 90 tablet 3   atorvastatin (LIPITOR) 10 MG tablet Take 10 mg by mouth daily.     clopidogrel (PLAVIX) 75 MG tablet Take 1 tablet (75 mg total) by mouth daily with breakfast. 90 tablet 3   ezetimibe (ZETIA) 10 MG tablet Take 1 tablet by mouth daily.     lisinopril (PRINIVIL,ZESTRIL) 10 MG tablet Take 1 tablet (10 mg total) by mouth daily. 90 tablet 3   metoprolol succinate (TOPROL-XL) 25 MG 24 hr tablet Take 1 tablet (25 mg total) by mouth daily. 90 tablet 3    cetirizine (ZYRTEC) 10 MG tablet Take 1 tablet (10 mg total) by mouth daily. (Patient not taking: Reported on 03/21/2020) 90 tablet 3   nitroGLYCERIN (NITROSTAT) 0.4 MG SL tablet Place 1 tablet (0.4 mg total) under the tongue every 5 (five) minutes as needed for chest pain. (Patient not taking: Reported on 03/21/2020) 30 tablet 0   No current facility-administered medications for this visit.    Allergies as of 03/21/2020   (No Known Allergies)    Family History  Problem Relation Age of Onset   Cancer Mother    Colon cancer Mother    Stomach cancer Mother    Diabetes Father    Esophageal cancer Neg Hx    Rectal cancer Neg Hx     Social History   Socioeconomic History   Marital status: Married    Spouse name: Not on file   Number of children: Not on file   Years of education: Not on file   Highest education level: Not on file  Occupational History   Not on file  Tobacco Use   Smoking status: Never Smoker   Smokeless tobacco: Never Used  Vaping Use   Vaping Use: Never used  Substance and Sexual Activity   Alcohol use: Yes    Comment: social   Drug use: No   Sexual activity: Never  Other Topics Concern   Not on file  Social History Narrative   Not on file   Social Determinants of Health   Financial Resource Strain:    Difficulty of Paying Living Expenses:   Food Insecurity:    Worried About Charity fundraiser in the Last Year:    Arboriculturist in the Last Year:   Transportation Needs:    Film/video editor (Medical):    Lack of Transportation (Non-Medical):   Physical Activity:    Days of Exercise per Week:    Minutes of Exercise per Session:   Stress:    Feeling of Stress :   Social Connections:    Frequency of Communication with Friends and Family:    Frequency of Social Gatherings with Friends and Family:    Attends Religious Services:    Active Member of Clubs or Organizations:    Attends Arts administrator:    Marital Status:   Intimate Partner Violence:    Fear of Current or Ex-Partner:    Emotionally Abused:    Physically Abused:    Sexually Abused:      Physical Exam: BP 110/76    Pulse 75    Ht 5\' 10"  (1.778 m)    Wt 195 lb 8 oz (88.7 kg)    BMI 28.05 kg/m  Constitutional: generally well-appearing Psychiatric: alert and oriented x3 Eyes: extraocular movements intact Mouth: oral pharynx moist, no lesions Neck: supple no lymphadenopathy Cardiovascular: heart regular rate and rhythm Lungs: clear to auscultation bilaterally Abdomen: soft, nontender, nondistended, no obvious ascites, no peritoneal signs, normal bowel sounds Extremities: no lower extremity edema bilaterally Skin: no lesions on visible extremities   Assessment and plan: 59 y.o. male with personal history of adenomatous polyps, ongoing Plavix use  I recommended repeat colonoscopy at his soonest convenience given his history of adenomatous and sessile serrated polyps 5 years ago.  He understands that he is at increased risk for bleeding complications during the procedure given his ongoing Plavix use.  I recommend he stop the Plavix for 5 days prior to the colonoscopy.  He understands it we will reach out to his cardiologist about the safety of that recommendation.  I see no reason for any further blood tests or imaging studies prior to him.    Please see the "Patient Instructions" section for addition details about the plan.   Owens Loffler, MD [facility] 03/21/2020, 1:41 PM  Cc: No ref. provider found  Total time on date of encounter was 45  minutes (this included time spent preparing to see the patient reviewing records; obtaining and/or reviewing separately obtained history; performing a medically appropriate exam and/or evaluation; counseling and educating the patient and family if present; ordering medications, tests or procedures if applicable; and documenting clinical information in  the health record).

## 2020-03-21 NOTE — Patient Instructions (Signed)
If you are age 57 or older, your body mass index should be between 23-30. Your Body mass index is 28.05 kg/m. If this is out of the aforementioned range listed, please consider follow up with your Primary Care Provider.  If you are age 55 or younger, your body mass index should be between 19-25. Your Body mass index is 28.05 kg/m. If this is out of the aformentioned range listed, please consider follow up with your Primary Care Provider.   You have been scheduled for a colonoscopy. Please follow written instructions given to you at your visit today.  Please pick up your prep supplies at the pharmacy within the next 1-3 days. If you use inhalers (even only as needed), please bring them with you on the day of your procedure.  Due to recent changes in healthcare laws, you may see the results of your imaging and laboratory studies on MyChart before your provider has had a chance to review them.  We understand that in some cases there may be results that are confusing or concerning to you. Not all laboratory results come back in the same time frame and the provider may be waiting for multiple results in order to interpret others.  Please give [place] 48 hours in order for your provider to thoroughly review all the results before contacting the office for clarification of your results.   Thank you for entrusting me with your care and choosing [facility].  Dr Ardis Hughs

## 2020-04-15 ENCOUNTER — Telehealth: Payer: Self-pay | Admitting: Gastroenterology

## 2020-04-15 NOTE — Telephone Encounter (Signed)
Letter was faxed to Dr Clayborn Bigness on 03-21-20.  Letter was resent today.  Will wait for response.

## 2020-04-15 NOTE — Telephone Encounter (Signed)
Patient advised that Dr Clayborn Bigness returned fax instructing that patient could hold Plavix 5 days prior to colonoscopy.  Patient advised to take last dose of Plavix on 04-19-20.  Patient agreed to plan and verbalized understanding.  No further questions.

## 2020-04-15 NOTE — Telephone Encounter (Signed)
Pt states he was told that his Cardiologist would have to approve him coming off the Plavix medication prior to having procedure on 04/25/2020. Pt would like to know if a nurse could do this for him and let him know something asap

## 2020-04-21 ENCOUNTER — Encounter: Payer: Self-pay | Admitting: Gastroenterology

## 2020-04-25 ENCOUNTER — Encounter: Payer: Self-pay | Admitting: Gastroenterology

## 2020-04-25 ENCOUNTER — Other Ambulatory Visit: Payer: Self-pay

## 2020-04-25 ENCOUNTER — Ambulatory Visit (AMBULATORY_SURGERY_CENTER): Payer: BC Managed Care – PPO | Admitting: Gastroenterology

## 2020-04-25 VITALS — BP 126/84 | HR 64 | Temp 98.6°F | Resp 14 | Ht 70.0 in | Wt 195.0 lb

## 2020-04-25 DIAGNOSIS — Z86010 Personal history of colonic polyps: Secondary | ICD-10-CM

## 2020-04-25 DIAGNOSIS — D123 Benign neoplasm of transverse colon: Secondary | ICD-10-CM | POA: Diagnosis not present

## 2020-04-25 MED ORDER — SODIUM CHLORIDE 0.9 % IV SOLN: 500.0000 mL | Freq: Once | INTRAVENOUS | Status: DC

## 2020-04-25 NOTE — Progress Notes (Signed)
PT taken to PACU. Monitors in place. VSS. Report given to RN. 

## 2020-04-25 NOTE — Progress Notes (Signed)
Called to room to assist during endoscopic procedure.  Patient ID and intended procedure confirmed with present staff. Received instructions for my participation in the procedure from the performing physician.  

## 2020-04-25 NOTE — Op Note (Signed)
[facility] Patient Name: Troy Anderson Procedure Date: 04/25/2020 10:01 AM MRN: [id number] Endoscopist: Milus Banister , MD Age: 59 Referring MD:  Date of Birth: May 04, 1961 Gender: Male Account #: [id number] Procedure:                Colonoscopy Indications:              High risk colon cancer surveillance: Personal                            history of colonic polyps; Colonoscopy 2016 found 3                            subcentimeter polyps. 1 was an adenoma, one was a                            sessile serrated polyp, one was a hyperplastic polyp Medicines:                Monitored Anesthesia Care Procedure:                Pre-Anesthesia Assessment:                           - Prior to the procedure, a History and Physical                            was performed, and patient medications and                            allergies were reviewed. The patient's tolerance of                            previous anesthesia was also reviewed. The risks                            and benefits of the procedure and the sedation                            options and risks were discussed with the patient.                            All questions were answered, and informed consent                            was obtained. Prior Anticoagulants: The patient has                            taken Plavix (clopidogrel), last dose was 5 days                            prior to procedure. ASA Grade Assessment: III - A                            patient with severe systemic disease. After  reviewing the risks and benefits, the patient was                            deemed in satisfactory condition to undergo the                            procedure.                           After obtaining informed consent, the colonoscope                            was passed under direct vision. Throughout the                            procedure, the patient's blood pressure, pulse,  and                            oxygen saturations were monitored continuously. The                            Colonoscope was introduced through the anus and                            advanced to the the cecum, identified by                            appendiceal orifice and ileocecal valve. The                            colonoscopy was performed without difficulty. The                            patient tolerated the procedure well. The quality                            of the bowel preparation was good. The ileocecal                            valve, appendiceal orifice, and rectum were                            photographed. Scope In: 10:03:26 AM Scope Out: 10:17:29 AM Scope Withdrawal Time: 0 hours 10 minutes 36 seconds  Total Procedure Duration: 0 hours 14 minutes 3 seconds  Findings:                 A 2 mm polyp was found in the transverse colon. The                            polyp was sessile. The polyp was removed with a                            cold snare. Resection and retrieval were complete.  The exam was otherwise without abnormality on                            direct and retroflexion views. Complications:            No immediate complications. Estimated blood loss:                            None. Estimated Blood Loss:     Estimated blood loss: none. Impression:               - One 2 mm polyp in the transverse colon, removed                            with a cold snare. Resected and retrieved.                           - The examination was otherwise normal on direct                            and retroflexion views. Recommendation:           - Patient has a contact number available for                            emergencies. The signs and symptoms of potential                            delayed complications were discussed with the                            patient. Return to normal activities tomorrow.                            Written  discharge instructions were provided to the                            patient.                           - Resume previous diet.                           - Continue present medications. OK to resume your                            plavix today.                           - Await pathology results. Milus Banister, MD 04/25/2020 10:21:14 AM This report has been signed electronically.

## 2020-04-25 NOTE — Progress Notes (Signed)
History reviewed with patient. VS: C.W

## 2020-04-25 NOTE — Patient Instructions (Signed)
Ok  to resume Plavix today  Handout on polyps given to you   Await pathology results on polyp removed    YOU HAD AN ENDOSCOPIC PROCEDURE TODAY AT [place]:   Refer to the procedure report that was given to you for any specific questions about what was found during the examination.  If the procedure report does not answer your questions, please call your gastroenterologist to clarify.  If you requested that your care partner not be given the details of your procedure findings, then the procedure report has been included in a sealed envelope for you to review at your convenience later.  YOU SHOULD EXPECT: Some feelings of bloating in the abdomen. Passage of more gas than usual.  Walking can help get rid of the air that was put into your GI tract during the procedure and reduce the bloating. If you had a lower endoscopy (such as a colonoscopy or flexible sigmoidoscopy) you may notice spotting of blood in your stool or on the toilet paper. If you underwent a bowel prep for your procedure, you may not have a normal bowel movement for a few days.  Please Note:  You might notice some irritation and congestion in your nose or some drainage.  This is from the oxygen used during your procedure.  There is no need for concern and it should clear up in a day or so.  SYMPTOMS TO REPORT IMMEDIATELY:   Following lower endoscopy (colonoscopy or flexible sigmoidoscopy):  Excessive amounts of blood in the stool  Significant tenderness or worsening of abdominal pains  Swelling of the abdomen that is new, acute  Fever of 100F or higher    For urgent or emergent issues, a gastroenterologist can be reached at any hour by calling [phone]. Do not use MyChart messaging for urgent concerns.    DIET:  We do recommend a small meal at first, but then you may proceed to your regular diet.  Drink plenty of fluids but you should avoid alcoholic beverages for 24 hours.  ACTIVITY:  You  should plan to take it easy for the rest of today and you should NOT DRIVE or use heavy machinery until tomorrow (because of the sedation medicines used during the test).    FOLLOW UP: Our staff will call the number listed on your records 48-72 hours following your procedure to check on you and address any questions or concerns that you may have regarding the information given to you following your procedure. If we do not reach you, we will leave a message.  We will attempt to reach you two times.  During this call, we will ask if you have developed any symptoms of COVID 19. If you develop any symptoms (ie: fever, flu-like symptoms, shortness of breath, cough etc.) before then, please call [phone].  If you test positive for Covid 19 in the 2 weeks post procedure, please call and report this information to [place].    If any biopsies were taken you will be contacted by phone or by letter within the next 1-3 weeks.  Please call us at [phone] if you have not heard about the biopsies in 3 weeks.    SIGNATURES/CONFIDENTIALITY: You and/or your care partner have signed paperwork which will be entered into your electronic medical record.  These signatures attest to the fact that that the information above on your After Visit Summary has been reviewed and is understood.  Full responsibility of the confidentiality of this  discharge information lies with you and/or your care-partner.

## 2020-04-29 ENCOUNTER — Telehealth: Payer: Self-pay | Admitting: *Deleted

## 2020-04-29 NOTE — Telephone Encounter (Signed)
  Follow up Call-  Call back number 04/25/2020  Post procedure Call Back phone  # [phone]  Permission to leave phone message Yes  Some recent data might be hidden     Patient questions:  Do you have a fever, pain , or abdominal swelling? No. Pain Score  0 *  Have you tolerated food without any problems? Yes.    Have you been able to return to your normal activities? Yes.    Do you have any questions about your discharge instructions: Diet   No. Medications  No. Follow up visit  No.  Do you have questions or concerns about your Care? No.  Actions: * If pain score is 4 or above: No action needed, pain <4.   1. Have you developed a fever since your procedure? no  2.   Have you had an respiratory symptoms (SOB or cough) since your procedure? no  3.   Have you tested positive for COVID 19 since your procedure no  4.   Have you had any family members/close contacts diagnosed with the COVID 19 since your procedure?  no   If yes to any of these questions please route to Joylene John, RN and Erenest Rasher, RN

## 2020-04-29 NOTE — Telephone Encounter (Signed)
First attempt, left VM.  

## 2020-05-02 ENCOUNTER — Encounter: Payer: Self-pay | Admitting: Gastroenterology
